# Patient Record
Sex: Female | Born: 1935 | Race: White | Hispanic: No | Smoking: Never smoker
Health system: Southern US, Community
[De-identification: ages and names within clinical notes are randomized; demographics above are authoritative.]

## PROBLEM LIST (undated history)

## (undated) DIAGNOSIS — I4892 Unspecified atrial flutter: Secondary | ICD-10-CM

## (undated) DIAGNOSIS — H9190 Unspecified hearing loss, unspecified ear: Secondary | ICD-10-CM

## (undated) DIAGNOSIS — J90 Pleural effusion, not elsewhere classified: Secondary | ICD-10-CM

## (undated) DIAGNOSIS — I509 Heart failure, unspecified: Secondary | ICD-10-CM

## (undated) DIAGNOSIS — I4821 Permanent atrial fibrillation: Secondary | ICD-10-CM

## (undated) HISTORY — DX: Pleural effusion, not elsewhere classified: J90

## (undated) HISTORY — DX: Heart failure, unspecified: I50.9

## (undated) HISTORY — PX: APPENDECTOMY: SHX54

## (undated) HISTORY — DX: Permanent atrial fibrillation: I48.21

## (undated) HISTORY — DX: Unspecified atrial flutter: I48.92

## (undated) HISTORY — PX: FIXATION KYPHOPLASTY: SHX860

## (undated) HISTORY — PX: CHOLECYSTECTOMY: SHX55

---

## 1998-01-26 ENCOUNTER — Observation Stay (HOSPITAL_COMMUNITY): Admission: RE | Admit: 1998-01-26 | Discharge: 1998-01-27 | Payer: Self-pay

## 1998-02-03 ENCOUNTER — Ambulatory Visit (HOSPITAL_COMMUNITY): Admission: RE | Admit: 1998-02-03 | Discharge: 1998-02-03 | Payer: Self-pay

## 1998-03-23 ENCOUNTER — Other Ambulatory Visit: Admission: RE | Admit: 1998-03-23 | Discharge: 1998-03-23 | Payer: Self-pay | Admitting: Cardiology

## 1999-11-02 ENCOUNTER — Encounter: Payer: Self-pay | Admitting: Cardiology

## 1999-11-02 ENCOUNTER — Encounter: Admission: RE | Admit: 1999-11-02 | Discharge: 1999-11-02 | Payer: Self-pay | Admitting: Cardiology

## 1999-11-14 ENCOUNTER — Encounter: Admission: RE | Admit: 1999-11-14 | Discharge: 1999-11-14 | Payer: Self-pay | Admitting: Gastroenterology

## 1999-11-14 ENCOUNTER — Encounter: Payer: Self-pay | Admitting: Gastroenterology

## 2000-11-16 ENCOUNTER — Encounter: Payer: Self-pay | Admitting: Internal Medicine

## 2000-11-16 ENCOUNTER — Encounter: Admission: RE | Admit: 2000-11-16 | Discharge: 2000-11-16 | Payer: Self-pay | Admitting: Internal Medicine

## 2001-09-04 ENCOUNTER — Encounter: Admission: RE | Admit: 2001-09-04 | Discharge: 2001-09-04 | Payer: Self-pay | Admitting: Internal Medicine

## 2001-09-04 ENCOUNTER — Encounter: Payer: Self-pay | Admitting: Internal Medicine

## 2001-11-18 ENCOUNTER — Encounter: Payer: Self-pay | Admitting: Internal Medicine

## 2001-11-18 ENCOUNTER — Encounter: Admission: RE | Admit: 2001-11-18 | Discharge: 2001-11-18 | Payer: Self-pay | Admitting: Internal Medicine

## 2002-01-15 ENCOUNTER — Emergency Department (HOSPITAL_COMMUNITY): Admission: EM | Admit: 2002-01-15 | Discharge: 2002-01-15 | Payer: Self-pay | Admitting: Emergency Medicine

## 2002-11-21 ENCOUNTER — Encounter: Admission: RE | Admit: 2002-11-21 | Discharge: 2002-11-21 | Payer: Self-pay | Admitting: Internal Medicine

## 2002-11-21 ENCOUNTER — Encounter: Payer: Self-pay | Admitting: Internal Medicine

## 2003-11-24 ENCOUNTER — Ambulatory Visit (HOSPITAL_COMMUNITY): Admission: RE | Admit: 2003-11-24 | Discharge: 2003-11-24 | Payer: Self-pay | Admitting: Internal Medicine

## 2004-10-15 ENCOUNTER — Emergency Department (HOSPITAL_COMMUNITY): Admission: EM | Admit: 2004-10-15 | Discharge: 2004-10-16 | Payer: Self-pay | Admitting: Emergency Medicine

## 2004-10-18 ENCOUNTER — Inpatient Hospital Stay (HOSPITAL_COMMUNITY): Admission: EM | Admit: 2004-10-18 | Discharge: 2004-10-23 | Payer: Self-pay | Admitting: Emergency Medicine

## 2004-10-19 ENCOUNTER — Encounter: Payer: Self-pay | Admitting: Cardiology

## 2005-06-26 ENCOUNTER — Ambulatory Visit (HOSPITAL_COMMUNITY): Admission: RE | Admit: 2005-06-26 | Discharge: 2005-06-26 | Payer: Self-pay | Admitting: Internal Medicine

## 2005-09-25 ENCOUNTER — Ambulatory Visit (HOSPITAL_COMMUNITY): Admission: RE | Admit: 2005-09-25 | Discharge: 2005-09-25 | Payer: Self-pay | Admitting: Internal Medicine

## 2006-07-05 ENCOUNTER — Ambulatory Visit (HOSPITAL_COMMUNITY): Admission: RE | Admit: 2006-07-05 | Discharge: 2006-07-05 | Payer: Self-pay | Admitting: Internal Medicine

## 2007-07-10 ENCOUNTER — Ambulatory Visit (HOSPITAL_COMMUNITY): Admission: RE | Admit: 2007-07-10 | Discharge: 2007-07-10 | Payer: Self-pay | Admitting: Internal Medicine

## 2008-08-06 ENCOUNTER — Ambulatory Visit (HOSPITAL_COMMUNITY): Admission: RE | Admit: 2008-08-06 | Discharge: 2008-08-06 | Payer: Self-pay | Admitting: Internal Medicine

## 2009-04-06 ENCOUNTER — Ambulatory Visit (HOSPITAL_COMMUNITY): Admission: RE | Admit: 2009-04-06 | Discharge: 2009-04-06 | Payer: Self-pay | Admitting: Internal Medicine

## 2009-08-23 ENCOUNTER — Ambulatory Visit (HOSPITAL_COMMUNITY): Admission: RE | Admit: 2009-08-23 | Discharge: 2009-08-23 | Payer: Self-pay | Admitting: Internal Medicine

## 2010-03-09 ENCOUNTER — Ambulatory Visit: Payer: Self-pay | Admitting: Cardiology

## 2010-04-06 ENCOUNTER — Ambulatory Visit: Payer: Self-pay | Admitting: *Deleted

## 2010-04-12 ENCOUNTER — Ambulatory Visit: Payer: Self-pay | Admitting: Cardiology

## 2010-05-02 ENCOUNTER — Encounter: Admission: RE | Admit: 2010-05-02 | Discharge: 2010-05-02 | Payer: Self-pay | Admitting: Internal Medicine

## 2010-05-10 ENCOUNTER — Ambulatory Visit: Payer: Self-pay | Admitting: Cardiology

## 2010-05-13 ENCOUNTER — Ambulatory Visit: Payer: Self-pay | Admitting: Cardiology

## 2010-05-18 ENCOUNTER — Ambulatory Visit: Payer: Self-pay | Admitting: Cardiovascular Disease

## 2010-05-25 ENCOUNTER — Ambulatory Visit (HOSPITAL_COMMUNITY)
Admission: RE | Admit: 2010-05-25 | Discharge: 2010-05-25 | Payer: Self-pay | Source: Home / Self Care | Admitting: Internal Medicine

## 2010-05-31 ENCOUNTER — Ambulatory Visit: Payer: Self-pay | Admitting: Cardiology

## 2010-06-10 ENCOUNTER — Ambulatory Visit: Payer: Self-pay | Admitting: Cardiology

## 2010-06-24 ENCOUNTER — Ambulatory Visit: Payer: Self-pay | Admitting: Cardiology

## 2010-07-01 ENCOUNTER — Ambulatory Visit: Payer: Self-pay | Admitting: Cardiology

## 2010-07-08 ENCOUNTER — Ambulatory Visit: Payer: Self-pay | Admitting: Cardiology

## 2010-07-22 ENCOUNTER — Ambulatory Visit: Payer: Self-pay | Admitting: Cardiology

## 2010-08-05 ENCOUNTER — Ambulatory Visit (HOSPITAL_COMMUNITY)
Admission: RE | Admit: 2010-08-05 | Discharge: 2010-08-05 | Payer: Self-pay | Source: Home / Self Care | Attending: Internal Medicine | Admitting: Internal Medicine

## 2010-08-17 ENCOUNTER — Ambulatory Visit (HOSPITAL_COMMUNITY)
Admission: RE | Admit: 2010-08-17 | Discharge: 2010-08-17 | Payer: Self-pay | Source: Home / Self Care | Attending: Interventional Radiology | Admitting: Interventional Radiology

## 2010-08-22 LAB — CBC
HCT: 41.2 % (ref 36.0–46.0)
Hemoglobin: 13.7 g/dL (ref 12.0–15.0)
MCH: 30.9 pg (ref 26.0–34.0)
MCHC: 33.3 g/dL (ref 30.0–36.0)
MCV: 93 fL (ref 78.0–100.0)
Platelets: 148 10*3/uL — ABNORMAL LOW (ref 150–400)
RBC: 4.43 MIL/uL (ref 3.87–5.11)
RDW: 13 % (ref 11.5–15.5)
WBC: 6.1 10*3/uL (ref 4.0–10.5)

## 2010-08-22 LAB — BASIC METABOLIC PANEL
BUN: 16 mg/dL (ref 6–23)
CO2: 29 mEq/L (ref 19–32)
Calcium: 9.5 mg/dL (ref 8.4–10.5)
Chloride: 99 mEq/L (ref 96–112)
Creatinine, Ser: 0.76 mg/dL (ref 0.4–1.2)
GFR calc Af Amer: >60 mL/min (ref >60)
GFR calc non Af Amer: >60 mL/min (ref >60)
Glucose, Bld: 95 mg/dL (ref 70–99)
Potassium: 3.8 mEq/L (ref 3.5–5.1)
Sodium: 138 mEq/L (ref 135–145)

## 2010-08-22 LAB — APTT: aPTT: 27 seconds (ref 24–37)

## 2010-08-22 LAB — PROTIME-INR
INR: 1.05 (ref 0.00–1.49)
Prothrombin Time: 13.9 seconds (ref 11.6–15.2)

## 2010-08-23 ENCOUNTER — Encounter (HOSPITAL_COMMUNITY)
Admission: RE | Admit: 2010-08-23 | Discharge: 2010-09-06 | Payer: Self-pay | Source: Home / Self Care | Attending: Internal Medicine | Admitting: Internal Medicine

## 2010-08-31 ENCOUNTER — Ambulatory Visit (HOSPITAL_COMMUNITY)
Admission: RE | Admit: 2010-08-31 | Discharge: 2010-08-31 | Payer: Self-pay | Source: Home / Self Care | Attending: Interventional Radiology | Admitting: Interventional Radiology

## 2010-09-06 ENCOUNTER — Ambulatory Visit: Payer: Self-pay | Admitting: Cardiology

## 2010-09-22 ENCOUNTER — Other Ambulatory Visit (INDEPENDENT_AMBULATORY_CARE_PROVIDER_SITE_OTHER): Payer: Medicare Other

## 2010-09-22 DIAGNOSIS — I4891 Unspecified atrial fibrillation: Secondary | ICD-10-CM

## 2010-09-22 DIAGNOSIS — Z7901 Long term (current) use of anticoagulants: Secondary | ICD-10-CM

## 2010-10-11 ENCOUNTER — Ambulatory Visit (INDEPENDENT_AMBULATORY_CARE_PROVIDER_SITE_OTHER): Payer: Medicare Other | Admitting: Cardiology

## 2010-10-11 DIAGNOSIS — Z792 Long term (current) use of antibiotics: Secondary | ICD-10-CM

## 2010-10-11 DIAGNOSIS — I4891 Unspecified atrial fibrillation: Secondary | ICD-10-CM

## 2010-11-08 ENCOUNTER — Ambulatory Visit (INDEPENDENT_AMBULATORY_CARE_PROVIDER_SITE_OTHER): Payer: Medicare Other | Admitting: *Deleted

## 2010-11-08 DIAGNOSIS — I4891 Unspecified atrial fibrillation: Secondary | ICD-10-CM

## 2010-11-08 DIAGNOSIS — Z7901 Long term (current) use of anticoagulants: Secondary | ICD-10-CM

## 2010-11-08 LAB — POCT INR: INR: 2.7

## 2010-11-23 ENCOUNTER — Ambulatory Visit (HOSPITAL_COMMUNITY): Payer: Medicare Other | Attending: Internal Medicine

## 2010-11-23 DIAGNOSIS — M810 Age-related osteoporosis without current pathological fracture: Secondary | ICD-10-CM | POA: Insufficient documentation

## 2010-12-09 ENCOUNTER — Ambulatory Visit (INDEPENDENT_AMBULATORY_CARE_PROVIDER_SITE_OTHER): Payer: Medicare Other | Admitting: *Deleted

## 2010-12-09 DIAGNOSIS — I4891 Unspecified atrial fibrillation: Secondary | ICD-10-CM

## 2010-12-09 LAB — POCT INR: INR: 2.8

## 2010-12-23 NOTE — Consult Note (Signed)
NAME:  [name], [name] NO.:  [id number]   MEDICAL RECORD NO.:  [id number]          PATIENT TYPE:  INP   LOCATION:  1830                         FACILITY:  [facility]   PHYSICIAN:  Colleen Can. Deborah Chalk, M.D.DATE OF BIRTH:  1920-05-14   DATE OF CONSULTATION:  10/18/2004  DATE OF DISCHARGE:                                   CONSULTATION   Thank you very much for asking [place] to see Ms. Limon.  She is a very pleasant  77 year old female admitted with pneumonia.  She was also noted to have  atrial fibrillation with a rapid ventricular response of new onset.  She  notes that it began approximately four days ago and she began to feel queasy  and felt tired, had a generalized aching over her chest.  She called her  primary care physician on Friday (three days ago) and was started on  antibiotics.  She had emergency room visit this past Sunday (one to two days  ago) and was noted to have atrial fibrillation.  Chest x-ray showed  pneumonia.  She had appointment today with Dr. Waynard Edwards and was admitted for  evaluation of her pneumonia as well as new onset atrial fibrillation and  flutter.   PAST MEDICAL HISTORY:  She had an exploratory laparotomy with pregnancy.  She had history of appendectomy, cholecystectomy in 1999, cataract surgery,  osteoporosis, osteoarthritis, hypertension, and glaucoma.   ALLERGIES:  SULFA, DOXYCYCLINE, CODEINE, DICLOFENATE, FOSAMAX, PREVACID.   CURRENT MEDICATIONS:  Calcium, Actinol and Xalatan eye drops.   FAMILY HISTORY:  Father died age 66 of prostate cancer, mother died age 84  of pneumonia and measles.   SOCIAL HISTORY:  Recently, she has been caring for her sister, rarely uses  alcohol and never smokes.  She is married and retired.   REVIEW OF SYSTEMS:  She has no fever that she notes but she has had chills.  She has had a slight cough today but none really before that.  She was not  aware of atrial fibrillation, but she does have chronic skips  that she  notes.  She did have probable hallucinations related to Avalox.   PHYSICAL EXAMINATION:  GENERAL:  She is pleasant.  She is in no acute  distress.  She is elderly.  VITAL SIGNS:  Blood pressure 137/82, heart rate is 103 and regular.  Temperature was 98.6.  SKIN:  Warm and dry.  Color is normal.  NECK:  Supple, no jugular venous distention.  LUNGS:  Congested in both bases, left somewhat greater than right.  HEART:  Fairly regular rhythm, approximately 85 to 90.  There is no  significant murmur.  ABDOMEN:  Soft, nontender, no organomegaly.  EXTREMITIES:  Without edema.   LABORATORY DATA:  Chest x-ray showed increasing airspace disease in the left  lung and also questionable infiltrate on the right base.  CBC shows white  count 15,000, hematocrit of 33.  Chemistries are basically normal, troponin  and CK are negative.  EKG shows atrial flutter with a controlled ventricular  response.   OVERALL IMPRESSION:  1.  Atrial fibrillation/flutter with controlled ventricular  response.  2.  Pneumonia.   PLAN:  Agree with treatment of pneumonia as well as rate control and  anticoagulation.  Will check a 2D echo as well as a TSH and thyroid studies.  We will try to start Coumadin at this point.  If she converts to a sinus  rhythm, we may continue the Coumadin for only a short period of time.  If  she does not convert spontaneously to normal sinus rhythm, we will consider  outpatient cardioversion in approximately three to four weeks.  2D echo is  pending.      SNT/MEDQ  D:  10/18/2004  T:  10/18/2004  Job:  [id number]   cc:   Loraine Leriche A. Waynard Edwards, M.D.  [address]  [place]  [place] [address]  Fax: [phone]

## 2010-12-23 NOTE — Discharge Summary (Signed)
NAME:  [name], [name]              ACCOUNT NO.:  [id number]   MEDICAL RECORD NO.:  [id number]          PATIENT TYPE:  INP   LOCATION:  3703                         FACILITY:  [facility]   PHYSICIAN:  Mark A. Perini, M.D.   DATE OF BIRTH:  03-17-1920   DATE OF ADMISSION:  10/18/2004  DATE OF DISCHARGE:  10/23/2004                                 DISCHARGE SUMMARY   DISCHARGE DIAGNOSES:  1.  Community-acquired pneumonia.  2.  New onset atrial fibrillation.  3.  Hypokalemia.  4.  Osteoporosis.  5.  Anemia, which will need outpatient followup.   PROCEDURES:  1.  Cardiology consultation.  2.  A 2D echocardiogram which showed an ejection fraction of 65%, mild      aortic regurgitation, mild tricuspid regurgitation, mildly increased      pulmonary artery systolic pressure, but otherwise normal.   DISCHARGE MEDICATIONS:  1.  Prilosec 20 mg daily with food.  2.  Mucinex over the counter 600 mg b.i.d. as needed for cough.  3.  Cardizem CD 240 mg one daily.  4.  Potassium chloride 20 mg daily.  5.  Coumadin 5 mg 1 tablet daily starting on Monday after discharge.  6.  Tylenol 650 mg q.6 h. as needed.  7.  Augmentin 875 mg b.i.d. with food for seven further days.   HISTORY OF PRESENT ILLNESS:  [name] is a pleasant 77 year old female  admitted with pneumonia.  She was also found to have new onset of atrial  fibrillation with rapid ventricular response.  Her syndrome started  approximately four to five days ago when she began to feel queasy and tired  and have some aching over her chest area.  She also had some cough and  chills.  She was started empirically on antibiotics, and then the following  day she presented to the emergency room.  At that time, she was noted to  have pneumonia and was continued on antibiotics.  She was also noted to have  atrial fibrillation.  She followed up in our office two days later and was  found to still be in atrial fibrillation and felt quite poorly and  therefore  was admitted for further care.   HOSPITAL COURSE:  [name] was admitted to a telemetry bed.  She remained  in atrial fibrillation during her entire stay.  Repeat chest x-ray confirmed  bilateral community-acquired pneumonia. She was treated with Rocephin and  azithromycin intravenously.  She did not have any hypoxia and remained  stable from a pulmonary standpoint.  She was treated with Atrovent  nebulizers and Mucinex as well as antibiotics.  Cardiology was consulted to  help manage her atrial fibrillation.  She was placed on a heparin drip and  was anticoagulated with Coumadin.  Her rate was controlled with oral  Cardizem therapy.  By October 23, 2004, she was deemed stable for discharge  home.   DISCHARGE PHYSICAL EXAMINATION:  VITAL SIGNS:  Stable.  BLOOD PRESSURE:  118/73.  TEMPERATURE:  She had no fevers.  HEART RATE:  She remained in atrial fibrillation.  LUNGS:  Clear bilaterally.  HEART:  Irregularly irregular.  EXTREMITIES:  There was no edema.  ABDOMEN:  Soft.  SATURATIONS:  100% on room air.   DISCHARGE LABORATORY DATA:  INR was moving out and was 1.4 at the time of  discharge.  Sodium 137, potassium 4.6, chloride 106, CO2 25, BUN 4,  creatinine 0.7, glucose 104.  White count 9, which was decreased compared to  admission, hemoglobin 11, platelet count 347,000.   FOLLOW-UP INSTRUCTIONS:  1.  [name] is to follow a low-salt diet.  2.  She is to follow up in Dr. Ronnald Nian office for management of her      Coumadin and possible later cardioversion.  3.  She will follow up in our office in two weeks to ensure that her      pneumonia is resolving.  4.  She is to call at any time if there are any problems or concerns.      MAP/MEDQ  D:  10/26/2004  T:  10/26/2004  Job:  [id number]   cc:   Colleen Can. Deborah Chalk, M.D.  Fax: [phone]

## 2010-12-23 NOTE — H&P (Signed)
NAME:  [name], [name]              ACCOUNT NO.:  [id number]   MEDICAL RECORD NO.:  [id number]          PATIENT TYPE:  INP   LOCATION:  1830                         FACILITY:  [facility]   PHYSICIAN:  Mark A. Perini, M.D.   DATE OF BIRTH:  [birth year]   DATE OF ADMISSION:  10/18/2004  DATE OF DISCHARGE:                                HISTORY & PHYSICAL   CHIEF COMPLAINT:  1.  Malaise.  2.  Fatigue.  3.  Productive cough.   HISTORY OF PRESENT ILLNESS:  [name] is a pleasant, 77 year old,  Caucasian female with past medical history significant for osteoporosis and  degenerative joint disease of the back.  She has actually been very healthy  and vigorous.  However, last week she developed some temperatures and chills  and some discomfort in her chest with deep breathing.  She called on October 14, 2004, with these symptoms and was placed on Avelox 400 mg daily, as well  as Mucinex and Tylenol and was told to increase her fluids.  However, by  Saturday, October 15, 2004, she was feeling better and presented to the [facility] Emergency Room.  There she was diagnosed with a possible left hilar  pneumonia.  She was continued on Avelox.  She was also noted to have a low  potassium level of 3.1 and was given a prescription for potassium, which she  has not filled.  Furthermore, she was found to be in atrial fibrillation.  There was a misunderstanding in that it was felt that she had this  chronically, although this is actually a new condition.  She presented to  our office today, not feeling any better than before.  She continued to have  some productive cough.  She has some dry mouth.  She has significant  malaise.  She does have some discomfort when she coughs or takes deep  breaths in her chest area.  She has had very poor appetite.  She has been  quite fatigued.  She feels the Avelox makes her feel quite queasy and sick  on her stomach.  She was inadvertently given two Avelox tablets in 24  tablets on October 15, 2004, which caused some brief hallucinations.  In the  office, she was found to be in atrial fibrillation with a ventricular rate  of approximately 100 beats per minute.  Given her fatigue and the onset of  new atrial fibrillation, which has persisted for at least three to four  days, it was decided to electively admit her to the hospital for further  management of her pneumonia and new atrial fibrillation.   PAST MEDICAL HISTORY:  1.  Status post exploratory laparotomy after her pregnancy in 1960, but no      pathology was found.  2.  History of appendectomy at that time.  3.  History of cholecystectomy in 1999.  4.  History of two cataract surgeries.  5.  G3, P2, SAB 1 parity status.  6.  Postmenopausal since her late 30s.  7.  Osteoporosis that is severe, originally diagnosed in 93.  She has  compression fractures at L1, L2, T7 and T8.  8.  Colon polyps.  9.  Diverticulosis.  10. Large duodenal diverticulum.  11. Degenerative joint disease of the back.  12. Cervical polyp noted in 2003.  13. First degree atrioventricular block noted by EKG in January of 2004.   ALLERGIES:  1.  SULFA.  2.  DOXYCYCLINE.  3.  CODEINE.  4.  DICLOFENAC.  5.  FOSAMAX.  6.  PREVACID.   CURRENT MEDICATIONS:  1.  Calcium 1500 mg daily.  2.  Actonel 35 mg each week.  3.  Xalatan eyedrops daily.   SOCIAL HISTORY:  She is married.  Her husband, Jake Shark, is with her.  She has  been married since 41.  She has one daughter and two sons.  She had one  son die of a tumor in his brainstem.  She has two grandsons.  She has  education extending through some business school.  She is retired and does  some Agricultural consultant work currently.  She has never smoked tobacco.  She has a  rare glass of wine.  No drug use history.   FAMILY HISTORY:  Father died at age 70 of probable prostate cancer.  Mother  died at age 68 of pneumonia and measles.  She has multiple siblings.  One  brother died  of drowning.  Two brothers are living.  One brother died and  had depression problems.   REVIEW OF SYSTEMS:  As per the history of present illness.  She has been  some short of breath and has had some trouble finishing sentences.  There  has been no blood from above or below.  She has been moving her bowels  fairly normally.  No significant edema noted.  Otherwise as per the history  of present illness.   PHYSICAL EXAMINATION:  WEIGHT:  104 pound, which is down 1 pound from her  last visit.  VITAL SIGNS:  Blood pressure 138/72, temperature 98.9 degrees, pulse 100,  97% saturation on room air.  GENERAL APPEARANCE:  She does appear somewhat ill and weak, however,  respirations are even and nonlabored.  HEENT:  Tympanic membranes are normal. The mucosa is normal.  NECK:  There is no JVD.  RESPIRATORY:  Decreased breath sounds bilaterally with a few rales in the  left base.  HEART:  Irregularly with a 1-2/6 murmur at the left sternal border in  systole.  There is no significant edema.  ABDOMEN:  Soft and nontender with normoactive bowel sounds.  NEUROLOGIC:  She is neurologically grossly intact.   ASSESSMENT AND PLAN:  1.  Pneumonia.  The patient is still struggling to recover from this and she      is not tolerating the Avelox very well.  Therefore, we will switch her      to Rocephin and azithromycin.  We will check a new chest x-ray as well.  2.  New atrial fibrillation with some elevated heart rate response.  We will      admit and place on a telemetry bed.  We will start a heparin drip.  We      will rate control with oral Cardizem.  We will obtain a cardiology      consult as      well.  3.  Hypokalemia.  Check potassium levels and replace as needed.   ETHICS:  This patient is a full code status.      MAP/MEDQ  D:  10/18/2004  T:  10/18/2004  Job:  [id number]   cc:   [facility]

## 2011-01-09 ENCOUNTER — Ambulatory Visit: Payer: Self-pay | Admitting: *Deleted

## 2011-01-09 NOTE — Progress Notes (Signed)
INR scheduled date is discontinued.  Pt will have her INR drawn at her PCP.

## 2011-02-15 ENCOUNTER — Encounter (HOSPITAL_COMMUNITY): Payer: Medicare Other | Attending: Internal Medicine

## 2011-02-15 DIAGNOSIS — M810 Age-related osteoporosis without current pathological fracture: Secondary | ICD-10-CM | POA: Insufficient documentation

## 2011-05-18 ENCOUNTER — Encounter (HOSPITAL_COMMUNITY): Payer: Medicare Other

## 2011-10-19 ENCOUNTER — Encounter (HOSPITAL_COMMUNITY): Payer: Self-pay

## 2011-10-23 ENCOUNTER — Ambulatory Visit (HOSPITAL_COMMUNITY)
Admission: RE | Admit: 2011-10-23 | Discharge: 2011-10-23 | Disposition: A | Discharge status: Home / Self Care | Payer: Medicare Other | Source: Ambulatory Visit | Attending: Internal Medicine | Admitting: Internal Medicine

## 2011-10-23 ENCOUNTER — Encounter (HOSPITAL_COMMUNITY): Payer: Self-pay

## 2011-10-23 DIAGNOSIS — M810 Age-related osteoporosis without current pathological fracture: Secondary | ICD-10-CM | POA: Insufficient documentation

## 2011-10-23 MED ORDER — SODIUM CHLORIDE 0.9 % IV SOLN
INTRAVENOUS | Status: DC
  Administered 2011-10-23: 11:00:00 via INTRAVENOUS

## 2011-10-23 MED ORDER — ZOLEDRONIC ACID 5 MG/100ML IV SOLN
5.0000 mg | Freq: Once | INTRAVENOUS | Status: AC
  Administered 2011-10-23: 5 mg via INTRAVENOUS
  Filled 2011-10-23: qty 100

## 2011-10-23 NOTE — Discharge Instructions (Signed)
Zoledronic Acid injection (Paget's Disease, Osteoporosis) What is this medicine? ZOLEDRONIC ACID (ZOE le dron ik AS id) lowers the amount of calcium loss from bone. It is used to treat Paget's disease and osteoporosis in women. This medicine may be used for other purposes; ask your health care provider or pharmacist if you have questions. What should I tell my health care provider before I take this medicine? They need to know if you have any of these conditions: -aspirin-sensitive asthma -dental disease -kidney disease -low levels of calcium in the blood -past surgery on the parathyroid gland or intestines -an unusual or allergic reaction to zoledronic acid, other medicines, foods, dyes, or preservatives -pregnant or trying to get pregnant -breast-feeding How should I use this medicine? This medicine is for infusion into a vein. It is given by a health care professional in a hospital or clinic setting. Talk to your pediatrician regarding the use of this medicine in children. This medicine is not approved for use in children. Overdosage: If you think you have taken too much of this medicine contact a poison control center or emergency room at once. NOTE: This medicine is only for you. Do not share this medicine with others. What if I miss a dose? It is important not to miss your dose. Call your doctor or health care professional if you are unable to keep an appointment. What may interact with this medicine? -certain antibiotics given by injection -NSAIDs, medicines for pain and inflammation, like ibuprofen or naproxen -some diuretics like bumetanide, furosemide -teriparatide This list may not describe all possible interactions. Give your health care provider a list of all the medicines, herbs, non-prescription drugs, or dietary supplements you use. Also tell them if you smoke, drink alcohol, or use illegal drugs. Some items may interact with your medicine. What should I watch for while  using this medicine? Visit your doctor or health care professional for regular checkups. It may be some time before you see the benefit from this medicine. Do not stop taking your medicine unless your doctor tells you to. Your doctor may order blood tests or other tests to see how you are doing. Women should inform their doctor if they wish to become pregnant or think they might be pregnant. There is a potential for serious side effects to an unborn child. Talk to your health care professional or pharmacist for more information. You should make sure that you get enough calcium and vitamin D while you are taking this medicine. Discuss the foods you eat and the vitamins you take with your health care professional. Some people who take this medicine have severe bone, joint, and/or muscle pain. This medicine may also increase your risk for a broken thigh bone. Tell your doctor right away if you have pain in your upper leg or groin. Tell your doctor if you have any pain that does not go away or that gets worse. What side effects may I notice from receiving this medicine? Side effects that you should report to your doctor or health care professional as soon as possible: -allergic reactions like skin rash, itching or hives, swelling of the face, lips, or tongue -breathing problems -changes in vision -feeling faint or lightheaded, falls -jaw burning, cramping, or pain -muscle cramps, stiffness, or weakness -trouble passing urine or change in the amount of urine Side effects that usually do not require medical attention (report to your doctor or health care professional if they continue or are bothersome): -bone, joint, or muscle pain -fever -  irritation at site where injected -loss of appetite -nausea, vomiting -stomach upset -tired This list may not describe all possible side effects. Call your doctor for medical advice about side effects. You may report side effects to FDA at 1-800-FDA-1088. Where  should I keep my medicine? This drug is given in a hospital or clinic and will not be stored at home. NOTE: This sheet is a summary. It may not cover all possible information. If you have questions about this medicine, talk to your doctor, pharmacist, or health care provider.  2012, [employer]. (01/20/2011 9:08:15 AM) 

## 2012-06-25 ENCOUNTER — Encounter: Payer: Self-pay | Admitting: Cardiology

## 2012-09-17 HISTORY — PX: GLAUCOMA SURGERY: SHX656

## 2012-09-30 ENCOUNTER — Encounter: Payer: Self-pay | Admitting: Cardiology

## 2012-10-30 ENCOUNTER — Other Ambulatory Visit (HOSPITAL_COMMUNITY): Payer: Self-pay | Admitting: Internal Medicine

## 2012-11-01 ENCOUNTER — Ambulatory Visit (HOSPITAL_COMMUNITY): Payer: Medicare Other

## 2012-11-04 ENCOUNTER — Ambulatory Visit (HOSPITAL_COMMUNITY)
Admission: RE | Admit: 2012-11-04 | Discharge: 2012-11-04 | Disposition: A | Discharge status: Home / Self Care | Payer: Medicare Other | Source: Ambulatory Visit | Attending: Internal Medicine | Admitting: Internal Medicine

## 2012-11-04 ENCOUNTER — Encounter (HOSPITAL_COMMUNITY): Payer: Self-pay

## 2012-11-04 DIAGNOSIS — M810 Age-related osteoporosis without current pathological fracture: Secondary | ICD-10-CM | POA: Insufficient documentation

## 2012-11-04 MED ORDER — SODIUM CHLORIDE 0.9 % IV SOLN
INTRAVENOUS | Status: AC
  Administered 2012-11-04: 13:00:00 via INTRAVENOUS

## 2012-11-04 MED ORDER — ZOLEDRONIC ACID 5 MG/100ML IV SOLN
5.0000 mg | Freq: Once | INTRAVENOUS | Status: AC
  Administered 2012-11-04: 5 mg via INTRAVENOUS
  Filled 2012-11-04: qty 100

## 2013-05-12 ENCOUNTER — Ambulatory Visit (INDEPENDENT_AMBULATORY_CARE_PROVIDER_SITE_OTHER): Payer: Medicare Other | Admitting: Pharmacist

## 2013-05-12 DIAGNOSIS — I4891 Unspecified atrial fibrillation: Secondary | ICD-10-CM

## 2013-05-12 LAB — POCT INR: INR: 2.4

## 2013-06-19 ENCOUNTER — Ambulatory Visit (INDEPENDENT_AMBULATORY_CARE_PROVIDER_SITE_OTHER): Payer: Medicare Other | Admitting: Pharmacist

## 2013-06-19 DIAGNOSIS — I4891 Unspecified atrial fibrillation: Secondary | ICD-10-CM

## 2013-06-19 LAB — POCT INR: INR: 2.5

## 2013-07-09 ENCOUNTER — Encounter: Payer: Self-pay | Admitting: Interventional Cardiology

## 2013-07-09 ENCOUNTER — Ambulatory Visit (INDEPENDENT_AMBULATORY_CARE_PROVIDER_SITE_OTHER): Payer: Medicare Other | Admitting: Interventional Cardiology

## 2013-07-09 VITALS — BP 130/84 | HR 50 | Ht 59.0 in | Wt 93.0 lb

## 2013-07-09 DIAGNOSIS — I4891 Unspecified atrial fibrillation: Secondary | ICD-10-CM

## 2013-07-09 DIAGNOSIS — Z7901 Long term (current) use of anticoagulants: Secondary | ICD-10-CM

## 2013-07-09 DIAGNOSIS — R4181 Age-related cognitive decline: Secondary | ICD-10-CM

## 2013-07-09 DIAGNOSIS — R54 Age-related physical debility: Secondary | ICD-10-CM

## 2013-07-09 NOTE — Patient Instructions (Signed)
Your physician recommends that you continue on your current medications as directed. Please refer to the Current Medication list given to you today.  Your physician wants you to follow-up in: 1 year. You will receive a reminder letter in the mail two months in advance. If you don't receive a letter, please call our office to schedule the follow-up appointment.  

## 2013-07-09 NOTE — Progress Notes (Signed)
Patient ID: [name], female   DOB: 16-Mar-1920, 79 y.o.   MRN: [id number] Past Medical History  Paroxysmal atrial fibrillation   Osteoporosis with T12 compression fracture      [address] [place], [place]  [address] Phone: [phone] Fax:  [phone]  Date:  07/09/2013   ID:  [name], DOB 10/30/1919, MRN [id number]  PCP:  Ezequiel Kayser, MD   ASSESSMENT:  1. Atrial fibrillation with controlled rate 2. Chronic anticoagulation therapy without bleeding complications  PLAN:  1. Clinical followup in one year 2. Call if balance problems, falls, leading, or syncope.   SUBJECTIVE: [name] is a 79 y.o. female who is doing well. She's weathering the storm after her husband's death. She denies chest pain, dyspnea, syncope, and palpitations. No bleeding complications.   Wt Readings from Last 3 Encounters:  07/09/13 93 lb (42.185 kg)  11/04/12 85 lb (38.556 kg)     Past Medical History  Diagnosis Date  . Osteoporosis   . Atrial fib/flutter, transient   . Glaucoma   . Osteoporosis     Current Outpatient Prescriptions  Medication Sig Dispense Refill  . calcium-vitamin D (OSCAL WITH D) 500-200 MG-UNIT per tablet Take 1 tablet by mouth daily.      . dorzolamide-timolol (COSOPT) 22.3-6.8 MG/ML ophthalmic solution 1 drop 2 (two) times daily.       . fluticasone (FLONASE) 50 MCG/ACT nasal spray Place 2 sprays into the nose as needed for rhinitis.      . Vitamin D, Ergocalciferol, (DRISDOL) 50000 UNITS CAPS Take 50,000 Units by mouth every 7 (seven) days.      Marland Kitchen warfarin (COUMADIN) 2.5 MG tablet Take 2.5 mg by mouth daily.      . zoledronic acid (RECLAST) 5 MG/100ML SOLN Inject 5 mg into the vein once.       No current facility-administered medications for this visit.    Allergies:    Allergies  Allergen Reactions  . Sulfa Antibiotics Anaphylaxis  . Risedronate Sodium     headaches    Social History:  The patient  reports that she  has never smoked. She does not have any smokeless tobacco history on file. She reports that she drinks alcohol.   ROS:  Please see the history of present illness.   Denies transient neurological complaints   All other systems reviewed and negative.   OBJECTIVE: VS:  BP 130/84  Pulse 50  Ht 4\' 11"  (1.499 m)  Wt 93 lb (42.185 kg)  BMI 18.77 kg/m2 Well nourished, well developed, in no acute distress, elderly and frail HEENT: normal Neck: JVD flat. Carotid bruit absent  Cardiac:  normal S1, S2; RRR; no murmur Lungs:  clear to auscultation bilaterally, no wheezing, rhonchi or rales Abd: soft, nontender, no hepatomegaly Ext: Edema absent. Pulses 2+ and symmetric Skin: warm and dry Neuro:  CNs 2-12 intact, no focal abnormalities noted  EKG:  Atrial flutter with   with flow rate of 240 beats per minute and variable ventricular response averaging 50 beats per minute    Signed, Darci Needle III, MD 07/09/2013 11:37 AM

## 2013-07-22 ENCOUNTER — Ambulatory Visit (INDEPENDENT_AMBULATORY_CARE_PROVIDER_SITE_OTHER): Payer: Medicare Other | Admitting: Pharmacist

## 2013-07-22 ENCOUNTER — Telehealth: Payer: Self-pay | Admitting: Cardiology

## 2013-07-22 DIAGNOSIS — I4891 Unspecified atrial fibrillation: Secondary | ICD-10-CM

## 2013-07-22 LAB — POCT INR: INR: 2.2

## 2013-07-22 NOTE — Telephone Encounter (Signed)
This encounter was opened as error

## 2013-09-02 ENCOUNTER — Ambulatory Visit (INDEPENDENT_AMBULATORY_CARE_PROVIDER_SITE_OTHER): Payer: Medicare Other | Admitting: Pharmacist

## 2013-09-02 DIAGNOSIS — Z5181 Encounter for therapeutic drug level monitoring: Secondary | ICD-10-CM

## 2013-09-02 DIAGNOSIS — I4891 Unspecified atrial fibrillation: Secondary | ICD-10-CM

## 2013-09-02 LAB — POCT INR: INR: 2.9

## 2013-10-14 ENCOUNTER — Ambulatory Visit (INDEPENDENT_AMBULATORY_CARE_PROVIDER_SITE_OTHER): Payer: Medicare Other | Admitting: Pharmacist

## 2013-10-14 DIAGNOSIS — I4891 Unspecified atrial fibrillation: Secondary | ICD-10-CM

## 2013-10-14 DIAGNOSIS — Z5181 Encounter for therapeutic drug level monitoring: Secondary | ICD-10-CM

## 2013-10-14 LAB — POCT INR: INR: 2.9

## 2013-10-14 MED ORDER — WARFARIN SODIUM 5 MG PO TABS: ORAL_TABLET | ORAL | Status: DC

## 2013-11-24 ENCOUNTER — Other Ambulatory Visit (HOSPITAL_COMMUNITY): Payer: Self-pay | Admitting: Internal Medicine

## 2013-11-25 ENCOUNTER — Encounter (HOSPITAL_COMMUNITY): Payer: Self-pay

## 2013-11-25 ENCOUNTER — Ambulatory Visit (HOSPITAL_COMMUNITY)
Admission: RE | Admit: 2013-11-25 | Discharge: 2013-11-25 | Disposition: A | Discharge status: Home / Self Care | Payer: Medicare Other | Source: Ambulatory Visit | Attending: Internal Medicine | Admitting: Internal Medicine

## 2013-11-25 ENCOUNTER — Ambulatory Visit (INDEPENDENT_AMBULATORY_CARE_PROVIDER_SITE_OTHER): Payer: Medicare Other | Admitting: *Deleted

## 2013-11-25 DIAGNOSIS — I4891 Unspecified atrial fibrillation: Secondary | ICD-10-CM

## 2013-11-25 DIAGNOSIS — Z5181 Encounter for therapeutic drug level monitoring: Secondary | ICD-10-CM

## 2013-11-25 DIAGNOSIS — M810 Age-related osteoporosis without current pathological fracture: Secondary | ICD-10-CM | POA: Insufficient documentation

## 2013-11-25 HISTORY — DX: Unspecified hearing loss, unspecified ear: H91.90

## 2013-11-25 LAB — POCT INR: INR: 2.5

## 2013-11-25 MED ORDER — ZOLEDRONIC ACID 5 MG/100ML IV SOLN
5.0000 mg | Freq: Once | INTRAVENOUS | Status: AC
  Administered 2013-11-25: 5 mg via INTRAVENOUS
  Filled 2013-11-25: qty 100

## 2013-11-25 MED ORDER — SODIUM CHLORIDE 0.9 % IV SOLN
250.0000 mL | Freq: Once | INTRAVENOUS | Status: AC
  Administered 2013-11-25: 250 mL via INTRAVENOUS

## 2013-11-25 NOTE — Discharge Instructions (Signed)
RECLAST °Zoledronic Acid injection (Paget's Disease, Osteoporosis) °What is this medicine? °ZOLEDRONIC ACID (ZOE le dron ik AS id) lowers the amount of calcium loss from bone. It is used to treat Paget's disease and osteoporosis in women. °This medicine may be used for other purposes; ask your health care provider or pharmacist if you have questions. °COMMON BRAND NAME(S): Reclast, Zometa °What should I tell my health care provider before I take this medicine? °They need to know if you have any of these conditions: °-aspirin-sensitive asthma °-cancer, especially if you are receiving medicines used to treat cancer °-dental disease or wear dentures °-infection °-kidney disease °-low levels of calcium in the blood °-past surgery on the parathyroid gland or intestines °-receiving corticosteroids like dexamethasone or prednisone °-an unusual or allergic reaction to zoledronic acid, other medicines, foods, dyes, or preservatives °-pregnant or trying to get pregnant °-breast-feeding °How should I use this medicine? °This medicine is for infusion into a vein. It is given by a health care professional in a hospital or clinic setting. °Talk to your pediatrician regarding the use of this medicine in children. This medicine is not approved for use in children. °Overdosage: If you think you have taken too much of this medicine contact a poison control center or emergency room at once. °NOTE: This medicine is only for you. Do not share this medicine with others. °What if I miss a dose? °It is important not to miss your dose. Call your doctor or health care professional if you are unable to keep an appointment. °What may interact with this medicine? °-certain antibiotics given by injection °-NSAIDs, medicines for pain and inflammation, like ibuprofen or naproxen °-some diuretics like bumetanide, furosemide °-teriparatide °This list may not describe all possible interactions. Give your health care provider a list of all the  medicines, herbs, non-prescription drugs, or dietary supplements you use. Also tell them if you smoke, drink alcohol, or use illegal drugs. Some items may interact with your medicine. °What should I watch for while using this medicine? °Visit your doctor or health care professional for regular checkups. It may be some time before you see the benefit from this medicine. Do not stop taking your medicine unless your doctor tells you to. Your doctor may order blood tests or other tests to see how you are doing. °Women should inform their doctor if they wish to become pregnant or think they might be pregnant. There is a potential for serious side effects to an unborn child. Talk to your health care professional or pharmacist for more information. °You should make sure that you get enough calcium and vitamin D while you are taking this medicine. Discuss the foods you eat and the vitamins you take with your health care professional. °Some people who take this medicine have severe bone, joint, and/or muscle pain. This medicine may also increase your risk for jaw problems or a broken thigh bone. Tell your doctor right away if you have severe pain in your jaw, bones, joints, or muscles. Tell your doctor if you have any pain that does not go away or that gets worse. °Tell your dentist and dental surgeon that you are taking this medicine. You should not have major dental surgery while on this medicine. See your dentist to have a dental exam and fix any dental problems before starting this medicine. Take good care of your teeth while on this medicine. Make sure you see your dentist for regular follow-up appointments. °What side effects may I notice from receiving this   medicine? °Side effects that you should report to your doctor or health care professional as soon as possible: °-allergic reactions like skin rash, itching or hives, swelling of the face, lips, or tongue °-anxiety, confusion, or depression °-breathing  problems °-changes in vision °-eye pain °-feeling faint or lightheaded, falls °-jaw pain, especially after dental work °-mouth sores °-muscle cramps, stiffness, or weakness °-trouble passing urine or change in the amount of urine °Side effects that usually do not require medical attention (report to your doctor or health care professional if they continue or are bothersome): °-bone, joint, or muscle pain °-constipation °-diarrhea °-fever °-hair loss °-irritation at site where injected °-loss of appetite °-nausea, vomiting °-stomach upset °-trouble sleeping °-trouble swallowing °-weak or tired °This list may not describe all possible side effects. Call your doctor for medical advice about side effects. You may report side effects to FDA at 1-800-FDA-1088. °Where should I keep my medicine? °This drug is given in a hospital or clinic and will not be stored at home. °NOTE: This sheet is a summary. It may not cover all possible information. If you have questions about this medicine, talk to your doctor, pharmacist, or health care provider. °© 2014, [employer]. (2013-01-06 10:03:48) °Osteoporosis °Throughout your life, your body breaks down old bone and replaces it with new bone. As you get older, your body does not replace bone as quickly as it breaks it down. By the age of 30 years, most people begin to gradually lose bone because of the imbalance between bone loss and replacement. Some people lose more bone than others. Bone loss beyond a specified normal degree is considered osteoporosis.  °Osteoporosis affects the strength and durability of your bones. The inside of the ends of your bones and your flat bones, like the bones of your pelvis, look like honeycomb, filled with tiny open spaces. As bone loss occurs, your bones become less dense. This means that the open spaces inside your bones become bigger and the walls between these spaces become thinner. This makes your bones weaker. Bones of a person with  osteoporosis can become so weak that they can break (fracture) during minor accidents, such as a simple fall. °CAUSES  °The following factors have been associated with the development of osteoporosis: °· Smoking. °· Drinking more than 2 alcoholic drinks several days per week. °· Long-term use of certain medicines: °· Corticosteroids. °· Chemotherapy medicines. °· Thyroid medicines. °· Antiepileptic medicines. °· Gonadal hormone suppression medicine. °· Immunosuppression medicine. °· Being underweight. °· Lack of physical activity. °· Lack of exposure to the sun. This can lead to vitamin D deficiency. °· Certain medical conditions: °· Certain inflammatory bowel diseases, such as Crohn disease and ulcerative colitis. °· Diabetes. °· Hyperthyroidism. °· Hyperparathyroidism. °RISK FACTORS °Anyone can develop osteoporosis. However, the following factors can increase your risk of developing osteoporosis: °· Gender Women are at higher risk than men. °· Age Being older than 50 years increases your risk. °· Ethnicity White and Asian people have an increased risk. °· Weight Being extremely underweight can increase your risk of osteoporosis. °· Family history of osteoporosis Having a family member who has developed osteoporosis can increase your risk. °SYMPTOMS  °Usually, people with osteoporosis have no symptoms.  °DIAGNOSIS  °Signs during a physical exam that may prompt your caregiver to suspect osteoporosis include: °· Decreased height. This is usually caused by the compression of the bones that form your spine (vertebrae) because they have weakened and become fractured. °· A curving or rounding of   the upper back (kyphosis). °To confirm signs of osteoporosis, your caregiver may request a procedure that uses 2 low-dose X-ray beams with different levels of energy to measure your bone mineral density (dual-energy X-ray absorptiometry [DXA]). Also, your caregiver may check your level of vitamin D. °TREATMENT  °The goal of  osteoporosis treatment is to strengthen bones in order to decrease the risk of bone fractures. There are different types of medicines available to help achieve this goal. Some of these medicines work by slowing the processes of bone loss. Some medicines work by increasing bone density. Treatment also involves making sure that your levels of calcium and vitamin D are adequate. °PREVENTION  °There are things you can do to help prevent osteoporosis. Adequate intake of calcium and vitamin D can help you achieve optimal bone mineral density. Regular exercise can also help, especially resistance and weight-bearing activities. If you smoke, quitting smoking is an important part of osteoporosis prevention. °MAKE SURE YOU: °· Understand these instructions. °· Will watch your condition. °· Will get help right away if you are not doing well or get worse. °FOR MORE INFORMATION °www.osteo.org and www.nof.org °Document Released: 05/03/2005 Document Revised: 11/18/2012 Document Reviewed: 07/08/2011 °ExitCare® Patient Information ©2014 [employer]. ° ° °

## 2013-11-26 ENCOUNTER — Encounter (HOSPITAL_COMMUNITY): Payer: Medicare Other

## 2014-01-06 ENCOUNTER — Ambulatory Visit (INDEPENDENT_AMBULATORY_CARE_PROVIDER_SITE_OTHER): Payer: Medicare Other

## 2014-01-06 DIAGNOSIS — Z5181 Encounter for therapeutic drug level monitoring: Secondary | ICD-10-CM

## 2014-01-06 DIAGNOSIS — I4891 Unspecified atrial fibrillation: Secondary | ICD-10-CM

## 2014-01-06 LAB — POCT INR: INR: 3

## 2014-02-17 ENCOUNTER — Ambulatory Visit (INDEPENDENT_AMBULATORY_CARE_PROVIDER_SITE_OTHER): Payer: Medicare Other

## 2014-02-17 DIAGNOSIS — I4891 Unspecified atrial fibrillation: Secondary | ICD-10-CM

## 2014-02-17 DIAGNOSIS — Z5181 Encounter for therapeutic drug level monitoring: Secondary | ICD-10-CM

## 2014-02-17 LAB — POCT INR: INR: 2.1

## 2014-03-07 ENCOUNTER — Other Ambulatory Visit: Payer: Self-pay | Admitting: Interventional Cardiology

## 2014-03-30 ENCOUNTER — Ambulatory Visit (INDEPENDENT_AMBULATORY_CARE_PROVIDER_SITE_OTHER): Payer: Medicare Other | Admitting: Pharmacist

## 2014-03-30 DIAGNOSIS — Z5181 Encounter for therapeutic drug level monitoring: Secondary | ICD-10-CM

## 2014-03-30 DIAGNOSIS — I4891 Unspecified atrial fibrillation: Secondary | ICD-10-CM

## 2014-03-30 LAB — POCT INR: INR: 2

## 2014-05-11 ENCOUNTER — Ambulatory Visit (INDEPENDENT_AMBULATORY_CARE_PROVIDER_SITE_OTHER): Payer: Medicare Other | Admitting: *Deleted

## 2014-05-11 DIAGNOSIS — Z5181 Encounter for therapeutic drug level monitoring: Secondary | ICD-10-CM

## 2014-05-11 DIAGNOSIS — I4891 Unspecified atrial fibrillation: Secondary | ICD-10-CM

## 2014-05-11 LAB — POCT INR: INR: 1.9

## 2014-05-19 ENCOUNTER — Ambulatory Visit (INDEPENDENT_AMBULATORY_CARE_PROVIDER_SITE_OTHER): Payer: Medicare Other | Admitting: *Deleted

## 2014-05-19 DIAGNOSIS — I4891 Unspecified atrial fibrillation: Secondary | ICD-10-CM

## 2014-05-19 DIAGNOSIS — Z5181 Encounter for therapeutic drug level monitoring: Secondary | ICD-10-CM

## 2014-05-19 LAB — POCT INR: INR: 3.3

## 2014-05-20 ENCOUNTER — Other Ambulatory Visit: Payer: Self-pay | Admitting: Dermatology

## 2014-06-01 ENCOUNTER — Ambulatory Visit (INDEPENDENT_AMBULATORY_CARE_PROVIDER_SITE_OTHER): Payer: Medicare Other | Admitting: *Deleted

## 2014-06-01 DIAGNOSIS — Z5181 Encounter for therapeutic drug level monitoring: Secondary | ICD-10-CM

## 2014-06-01 DIAGNOSIS — I4891 Unspecified atrial fibrillation: Secondary | ICD-10-CM

## 2014-06-01 LAB — POCT INR: INR: 2.4

## 2014-06-02 ENCOUNTER — Ambulatory Visit (INDEPENDENT_AMBULATORY_CARE_PROVIDER_SITE_OTHER): Payer: Medicare Other | Admitting: Nurse Practitioner

## 2014-06-02 ENCOUNTER — Encounter: Payer: Self-pay | Admitting: Nurse Practitioner

## 2014-06-02 ENCOUNTER — Telehealth: Payer: Self-pay | Admitting: Interventional Cardiology

## 2014-06-02 VITALS — BP 140/78 | HR 64 | Ht 60.0 in | Wt 93.4 lb

## 2014-06-02 DIAGNOSIS — I4821 Permanent atrial fibrillation: Secondary | ICD-10-CM

## 2014-06-02 DIAGNOSIS — J948 Other specified pleural conditions: Secondary | ICD-10-CM

## 2014-06-02 DIAGNOSIS — J90 Pleural effusion, not elsewhere classified: Secondary | ICD-10-CM | POA: Insufficient documentation

## 2014-06-02 DIAGNOSIS — I4892 Unspecified atrial flutter: Secondary | ICD-10-CM | POA: Insufficient documentation

## 2014-06-02 DIAGNOSIS — I482 Chronic atrial fibrillation: Secondary | ICD-10-CM

## 2014-06-02 LAB — ALBUMIN: Albumin: 3.2 g/dL — ABNORMAL LOW (ref 3.5–5.2)

## 2014-06-02 LAB — BASIC METABOLIC PANEL
BUN: 18 mg/dL (ref 6–23)
CALCIUM: 9.2 mg/dL (ref 8.4–10.5)
CHLORIDE: 102 meq/L (ref 96–112)
CO2: 30 meq/L (ref 19–32)
CREATININE: 0.7 mg/dL (ref 0.4–1.2)
GFR: 84.52 mL/min (ref >60.00)
GLUCOSE: 89 mg/dL (ref 70–99)
Potassium: 4.1 mEq/L (ref 3.5–5.1)
Sodium: 137 mEq/L (ref 135–145)

## 2014-06-02 MED ORDER — FUROSEMIDE 20 MG PO TABS: 20.0000 mg | ORAL_TABLET | Freq: Every day | ORAL | Status: DC

## 2014-06-02 NOTE — Progress Notes (Signed)
Patient Name: [name] Date of Encounter: 06/02/2014  Primary Care Provider:  Ezequiel KayserPERINI,MARK A, MD Primary Cardiologist:  Mendel RyderH. Smith, MD   Patient Profile  80 y/o female with a h/o persistent afib, who presents with a several wk h/o DOE, 3-4 day h/o LEE, and a new finding of R pleural effusion.  Problem List   Past Medical History  Diagnosis Date  . Osteoporosis   . Atrial Fibrillation and Flutter     a. chronic coumadin;  b. 10/2004 Echo: EF 55-65%, no rwma, mild AI, mild-mod TR.  . Glaucoma   . Osteoporosis   . Hard of hearing     bilateral hearing aids  . Pleural effusion, right     a. 05/2014   Past Surgical History  Procedure Laterality Date  . Fixation kyphoplasty      back  . Cholecystectomy    . Glaucoma surgery  09/17/12    Allergies  Allergies  Allergen Reactions  . Sulfa Antibiotics Anaphylaxis  . Risedronate Sodium     headaches    HPI  80 y/o female with the above problem list.  She has a h/o afib/flutter and is on chronic coumadin anticoagulation.  She lives by herself and generally does pretty well.  Over the past few mos, she has noted DOE when walking to and from her mailbox or with similar activities.  She has never had chest pain and does not usually have to stop her activities secondary to dyspnea but does have to slow her pace.  About 3-4 days ago, she began to experience bilateral lower extremity/ankle edema, which was new for her.  Her DOE has been stable and she has not had any pnd, orthopnea, n, v, dizziness, syncope, chest pain, or edema.  She was seen in coumadin clinic yesterday and mentioned her LEE and was advised to see her PCP.  She saw her PCP today and a CXR was obtained revealing a moderate R pleural effusion.  She was rx lasix 20mg  po daily and arrangements were made for f/u in our office today.  She has had no dyspnea at rest.  Home Medications  Prior to Admission medications   Medication Sig Start Date End Date Taking?  Authorizing Provider  calcium-vitamin D (OSCAL WITH D) 500-200 MG-UNIT per tablet Take 1 tablet by mouth daily.   Yes Historical Provider, MD  dorzolamide-timolol (COSOPT) 22.3-6.8 MG/ML ophthalmic solution 1 drop 2 (two) times daily.    Yes Historical Provider, MD  fluticasone (FLONASE) 50 MCG/ACT nasal spray Place 2 sprays into the nose as needed for rhinitis.   Yes Historical Provider, MD  Vitamin D, Ergocalciferol, (DRISDOL) 50000 UNITS CAPS Take 50,000 Units by mouth every 14 (fourteen) days.    Yes Historical Provider, MD  warfarin (COUMADIN) 5 MG tablet TAKE ONE-HALF (1/2) TABLET EVERY DAY OR AS DIRECTED BY COUMADIN CLINIC   Yes Lyn RecordsHenry W Smith III, MD  zoledronic acid (RECLAST) 5 MG/100ML SOLN Inject 5 mg into the vein once. Once a year.   Yes Historical Provider, MD  furosemide (LASIX) 20 MG tablet Take 1 tablet (20 mg total) by mouth daily. 06/02/14   Ok Anishristopher R Berge, NP    Review of Systems  DOE and lower ext edema as above.  She denies chest pain, palpitations, pnd, orthopnea, n, v, dizziness, syncope, or early satiety.  She does not weight herself @ home but believes that she runs around 88-90 lbs.  She is 93 lbs on our scale today.  All other systems reviewed and are otherwise negative except as noted above.  Physical Exam  Blood pressure 140/78, pulse 64, height 5' (1.524 m), weight 93 lb 6.4 oz (42.366 kg).  SPO2 93-96% with ambulation on room air. General: Pleasant, NAD Psych: Normal affect. Neuro: Alert and oriented X 3. Moves all extremities spontaneously. HEENT: Very HOH.  OTW nl.  Neck: Supple without bruits.  Neck veins are flat. Lungs:  Resp regular and unlabored, slightly diminished breath sounds in bilat bases but overall moves air well. Heart: IR, IR no s3, s4.  2/6 Syst murmur @ llsb. Abdomen: Soft, non-tender, non-distended, BS + x 4.  Extremities: No clubbing, cyanosis.  1-2+ bilat LEE to lower calf bilat. DP/PT/Radials 2+ and equal bilaterally.  Accessory  Clinical Findings  ECG - Afib, 64, lad, lafb, poor r prog.  No acute st/t changes.  Assessment & Plan  1.  R Pleural Effusion/DOE:  Pt presents with a several month h/o DOE with subsequent development of bilat LEE over the past 3-4 days.  CXR was done earlier today in her PCP's office and revealed a moderate sized R pleural effusion.  She has not had dyspnea at rest, pnd, orthopnea, or early satiety.  Her wt is about 3-5 lbs above where she would normally expect to be, though she does not weigh herself @ home regularly.  Her neck veins are flat and suprisingly, she moves air pretty well on exam.  I ambulated her in the office and she was able to maintain her O2 sats in the mid 90's on room air w/o significant dyspnea. She was rx lasix 20mg  by her PCP and I agree that an initial attempt at diuresis is worthwhile.  I discussed her case with Dr. Eden EmmsNishan and reviewed her CXR with him as well.  I will check a bmet and albumin today and will also arrange for repeat bmet, R lateral decubitus cxr, Echo, and office f/u for next Monday.  If Ss and effusion do not improve with gentle diuresis, we will then need to consider thoracic u/s to determine if thoracentesis is appropriate.  We would need to hold coumadin for thoracentesis.  She does not have a prior h/o CVA and should not require bridging.  2.  Permanent Afib:  Rate controlled w/o AVN blocking agent.  Chronically anticoagulated with coumadin and followed closely in coumadin clinic.  3.  Dispo:  BMET and Albumin today.  BMET, Echo, R lat decubitus CXR on Monday with office f/u that afternoon.  Nicolasa Duckinghristopher Berge, NP 06/02/2014, 2:58 PM

## 2014-06-02 NOTE — Patient Instructions (Signed)
Your physician has recommended you make the following change in your medication:    START TAKING LASIX 20 MG ONCE A DAY  90 DAY RX SENT IN TO PHARMACY   Your physician has requested that you have an echocardiogram. Echocardiography is a painless test that uses sound waves to create images of your heart. It provides your doctor with information about the size and shape of your heart and how well your heart's chambers and valves are working. This procedure takes approximately one hour. There are no restrictions for this procedure.  SCHEDULE  ON Monday OR EARLIER Tuesday NEXT WEEK   IF POSSIBLE   A chest x-ray takes a picture of the organs and structures inside the chest, including the heart, lungs, and blood vessels. This test can show several things, including, whether the heart is enlarges; whether fluid is building up in the lungs; and whether pacemaker / defibrillator leads are still in place. SCHEDULE  ON Monday NEXT WEEK  IF POSSIBLE    Your physician recommends that you return for lab work  TODAY BMET AND ALBUMIN   AND IN ONE WEEK FROM TODAY  A REPEAT BMET    Your physician recommends that you schedule a follow-up appointment WITH SCOTT WEAVER  OR LORI  AFTER CHEST XRAY AND ECHO

## 2014-06-02 NOTE — Telephone Encounter (Signed)
New message    Per kim patient need to be seen ASAP for sob. pleural effusion  In the office now.  Patient has appt with Ward Givenshris Berge on  10/27 @ 1:30 pm .    Office aware that Dr. Katrinka BlazingSmith is not in the office today.

## 2014-06-08 ENCOUNTER — Ambulatory Visit
Admission: RE | Admit: 2014-06-08 | Discharge: 2014-06-08 | Disposition: A | Discharge status: Home / Self Care | Payer: Medicare Other | Source: Ambulatory Visit | Attending: Nurse Practitioner | Admitting: Nurse Practitioner

## 2014-06-08 ENCOUNTER — Ambulatory Visit (INDEPENDENT_AMBULATORY_CARE_PROVIDER_SITE_OTHER): Payer: Medicare Other | Admitting: Physician Assistant

## 2014-06-08 ENCOUNTER — Other Ambulatory Visit: Payer: Self-pay | Admitting: Nurse Practitioner

## 2014-06-08 ENCOUNTER — Ambulatory Visit (HOSPITAL_COMMUNITY): Payer: Medicare Other | Attending: Nurse Practitioner | Admitting: Radiology

## 2014-06-08 ENCOUNTER — Encounter: Payer: Self-pay | Admitting: Physician Assistant

## 2014-06-08 ENCOUNTER — Other Ambulatory Visit (INDEPENDENT_AMBULATORY_CARE_PROVIDER_SITE_OTHER): Payer: Medicare Other | Admitting: *Deleted

## 2014-06-08 VITALS — BP 146/70 | HR 62 | Ht 59.0 in | Wt 90.0 lb

## 2014-06-08 DIAGNOSIS — I482 Chronic atrial fibrillation: Secondary | ICD-10-CM

## 2014-06-08 DIAGNOSIS — I4892 Unspecified atrial flutter: Secondary | ICD-10-CM

## 2014-06-08 DIAGNOSIS — J90 Pleural effusion, not elsewhere classified: Secondary | ICD-10-CM

## 2014-06-08 DIAGNOSIS — J948 Other specified pleural conditions: Secondary | ICD-10-CM | POA: Diagnosis not present

## 2014-06-08 DIAGNOSIS — I4821 Permanent atrial fibrillation: Secondary | ICD-10-CM | POA: Insufficient documentation

## 2014-06-08 LAB — BASIC METABOLIC PANEL
BUN: 21 mg/dL (ref 6–23)
CALCIUM: 9.4 mg/dL (ref 8.4–10.5)
CO2: 29 meq/L (ref 19–32)
Chloride: 98 mEq/L (ref 96–112)
Creatinine, Ser: 0.7 mg/dL (ref 0.4–1.2)
GFR: 90.55 mL/min (ref >60.00)
GLUCOSE: 89 mg/dL (ref 70–99)
Potassium: 4 mEq/L (ref 3.5–5.1)
SODIUM: 137 meq/L (ref 135–145)

## 2014-06-08 NOTE — Progress Notes (Signed)
Echocardiogram performed.  

## 2014-06-08 NOTE — Progress Notes (Signed)
[address] [place], [place]  [address] Phone: [phone]) [phone] Fax:  [phone]  Date:  06/08/2014   Patient ID:  Sarah MedicusFrances D [name], DOB 21-Dec-1919, MRN [id number]   PCP:  Ezequiel KayserPERINI,MARK A, MD  Cardiologist:  Dr. Katrinka BlazingSmith  History of Present Illness: Sarah PongFrances D [name] is a 80 y.o. female with history of persistent afib/flutter on chronic coumadin and minimal other PMH who presents for f/u of right pleural effusion. She lives by herself and has done fairly well with this. She no longer drives - her daughter looks after her and drives for her. She presented to her PCP last week with increasing DOE and LEE. CXR showed a moderate R pleural effusion.She was prescribed Lasix 20mg  daily and asked to follow up here. She saw Ward Givenshris Berge, NP on 06/02/14. POx was mid-90s. BMET that day was normal. Albumin was 3.2 but not likely to be the primary cause for her edema. The plan was that if symptoms did not improve with gentle diuresis, we would need to consider thoracentesis (for which Coumadin would need to be held). Today she had pre-arranged labs, CXR, and echo. BMET was completely normal and stable. CXR showed small amount of right pleural fluid with free-flowing component, mild cardiomegaly and possible mild vascular congestion. 2D echo is still pending.  She comes in feeling markedly better. Her daughter has noticed a big difference too. The patient is so pleased that her legs are back to normal. She is no longer dyspneic with exertion. She denies orthopnea, PND, chest pain, near syncope or syncope. She noted an initial increase in UOP when starting Lasix but this has come back to her usual amount. Weight is down to baseline and maintaining. POx 98% in clinic.  Recent Labs: 06/08/2014: Creatinine 0.7; Potassium 4.0  Wt Readings from Last 3 Encounters:  06/08/14 90 lb (40.824 kg)  06/02/14 93 lb 6.4 oz (42.366 kg)  11/25/13 90 lb 5 oz (40.965 kg)     Past Medical History  Diagnosis Date  .  Osteoporosis   . Atrial Fibrillation and Flutter     a. chronic coumadin;  b. 10/2004 Echo: EF 55-65%, no rwma, mild AI, mild-mod TR.  . Glaucoma   . Osteoporosis   . Hard of hearing     bilateral hearing aids  . Pleural effusion, right     a. 05/2014  . Permanent atrial fibrillation     Current Outpatient Prescriptions  Medication Sig Dispense Refill  . calcium-vitamin D (OSCAL WITH D) 500-200 MG-UNIT per tablet Take 1 tablet by mouth daily.    . dorzolamide-timolol (COSOPT) 22.3-6.8 MG/ML ophthalmic solution 1 drop 2 (two) times daily.     . fluticasone (FLONASE) 50 MCG/ACT nasal spray Place 2 sprays into the nose as needed for rhinitis.    . furosemide (LASIX) 20 MG tablet Take 1 tablet (20 mg total) by mouth daily. 90 tablet 3  . Vitamin D, Ergocalciferol, (DRISDOL) 50000 UNITS CAPS Take 50,000 Units by mouth every 14 (fourteen) days.     Marland Kitchen. warfarin (COUMADIN) 5 MG tablet TAKE ONE-HALF (1/2) TABLET EVERY DAY OR AS DIRECTED BY COUMADIN CLINIC 45 tablet 1  . zoledronic acid (RECLAST) 5 MG/100ML SOLN Inject 5 mg into the vein once. Once a year.     No current facility-administered medications for this visit.    Allergies:   Sulfa antibiotics and Risedronate sodium   Social History:  The patient  reports that she has never smoked. She does not  have any smokeless tobacco history on file. She reports that she drinks alcohol.   Family History:  The patient's family history includes Cancer in her father and mother.   ROS:  Please see the history of present illness.    All other systems reviewed and negative.   PHYSICAL EXAM:  VS:  BP 146/70 mmHg  Pulse 62  Ht 4\' 11"  (1.499 m)  Wt 90 lb (40.824 kg)  BMI 18.17 kg/m2 POx 98% RA Well nourished thin WF in no acute distress HEENT: normal Neck: no JVD Cardiac:  normal S1, S2; RRR; no murmur Lungs:  mildly decreased BS at bases, no wheezing, rhonchi or rales Abd: soft, nontender, no hepatomegaly Ext: no edema Skin: warm and  dry Neuro:  moves all extremities spontaneously, no focal abnormalities noted  ASSESSMENT AND PLAN:  1. Right pleural effusion/DOE suspected due to acute diastolic CHF - symptomatically much improved. POx is 98% in clinic today and she ambulated well without any dyspnea. LEE has resolved. Given the small amount of pleural fluid still present, will continue low-dose Lasix. I informed her and her daughter that if she ever runs into trouble with decreased oral intake or GI illness, she should call our office so we can discuss holding this to avoid dehydration. She is tolerating it well now and labs are stable. Salt restriction discussed. Unfortunately [employer] are an easy option for the patient. We gave her information on a lower-salt diet. I told them I think [employer] meal delivery service caters to low salt diets. She does not drink excess fluid. 2D echo still pending. 2. Permanent atrial fibrillation - continue Coumadin. Rate controlled. 3. Reported history of atrial flutter as well - continue Coumadin.  Dispo: F/u Dr. Katrinka BlazingSmith in December as planned.  Signed, Ronie Spiesayna Dunn, PA-C  06/08/2014 4:00 PM

## 2014-06-08 NOTE — Patient Instructions (Addendum)
Your physician recommends that you continue on your current medications as directed. Please refer to the Current Medications.   Your physician recommends that you schedule a follow-up appointment in: as already planned  Low-Sodium Eating Plan  Sodium raises blood pressure and causes water to be held in the body. Getting less sodium from food will help lower your blood pressure, reduce any swelling, and protect your heart, liver, and kidneys. We get sodium by adding salt (sodium chloride) to food. Most of our sodium comes from canned, boxed, and frozen foods. Restaurant foods, fast foods, and pizza are also very high in sodium. Even if you take medicine to lower your blood pressure or to reduce fluid in your body, getting less sodium from your food is important. WHAT IS MY PLAN? Most people should limit their sodium intake to 2,300 mg a day. Your health care provider recommends that you limit your sodium intake to __________ a day.  WHAT DO I NEED TO KNOW ABOUT THIS EATING PLAN? For the low-sodium eating plan, you will follow these general guidelines:  Choose foods with a % Daily Value for sodium of less than 5% (as listed on the food label).   Use salt-free seasonings or herbs instead of table salt or sea salt.   Check with your health care provider or pharmacist before using salt substitutes.   Eat fresh foods.  Eat more vegetables and fruits.  Limit canned vegetables. If you do use them, rinse them well to decrease the sodium.   Limit cheese to 1 oz (28 g) per day.   Eat lower-sodium products, often labeled as "lower sodium" or "no salt added."  Avoid foods that contain monosodium glutamate (MSG). MSG is sometimes added to [place] food and some canned foods.  Check food labels (Nutrition Facts labels) on foods to learn how much sodium is in one serving.  Eat more home-cooked food and less restaurant, buffet, and fast food.  When eating at a restaurant, ask that your food  be prepared with less salt or none, if possible.  HOW DO I READ FOOD LABELS FOR SODIUM INFORMATION? The Nutrition Facts label lists the amount of sodium in one serving of the food. If you eat more than one serving, you must multiply the listed amount of sodium by the number of servings. Food labels may also identify foods as:  Sodium free--Less than 5 mg in a serving.  Very low sodium--35 mg or less in a serving.  Low sodium--140 mg or less in a serving.  Light in sodium--50% less sodium in a serving. For example, if a food that usually has 300 mg of sodium is changed to become light in sodium, it will have 150 mg of sodium.  Reduced sodium--25% less sodium in a serving. For example, if a food that usually has 400 mg of sodium is changed to reduced sodium, it will have 300 mg of sodium. WHAT FOODS CAN I EAT? Grains Low-sodium cereals, including oats, puffed wheat and rice, and shredded wheat cereals. Low-sodium crackers. Unsalted rice and pasta. Lower-sodium bread.  Vegetables Frozen or fresh vegetables. Low-sodium or reduced-sodium canned vegetables. Low-sodium or reduced-sodium tomato sauce and paste. Low-sodium or reduced-sodium tomato and vegetable juices.  Fruits Fresh, frozen, and canned fruit. Fruit juice.  Meat and Other Protein Products Low-sodium canned tuna and salmon. Fresh or frozen meat, poultry, seafood, and fish. Lamb. Unsalted nuts. Dried beans, peas, and lentils without added salt. Unsalted canned beans. Homemade soups without salt. Eggs.  Dairy Milk.  Soy milk. Ricotta cheese. Low-sodium or reduced-sodium cheeses. Yogurt.  Condiments Fresh and dried herbs and spices. Salt-free seasonings. Onion and garlic powders. Low-sodium varieties of mustard and ketchup. Lemon juice.  Fats and Oils Reduced-sodium salad dressings. Unsalted butter.  Other Unsalted popcorn and pretzels.  The items listed above may not be a complete list of recommended foods or beverages.  Contact your dietitian for more options. WHAT FOODS ARE NOT RECOMMENDED? Grains Instant hot cereals. Bread stuffing, pancake, and biscuit mixes. Croutons. Seasoned rice or pasta mixes. Noodle soup cups. Boxed or frozen macaroni and cheese. Self-rising flour. Regular salted crackers. Vegetables Regular canned vegetables. Regular canned tomato sauce and paste. Regular tomato and vegetable juices. Frozen vegetables in sauces. Salted french fries. Olives. Rosita FirePickles. Relishes. Sauerkraut. Salsa. Meat and Other Protein Products Salted, canned, smoked, spiced, or pickled meats, seafood, or fish. Bacon, ham, sausage, hot dogs, corned beef, chipped beef, and packaged luncheon meats. Salt pork. Jerky. Pickled herring. Anchovies, regular canned tuna, and sardines. Salted nuts. Dairy Processed cheese and cheese spreads. Cheese curds. Blue cheese and cottage cheese. Buttermilk.  Condiments Onion and garlic salt, seasoned salt, table salt, and sea salt. Canned and packaged gravies. Worcestershire sauce. Tartar sauce. Barbecue sauce. Teriyaki sauce. Soy sauce, including reduced sodium. Steak sauce. Fish sauce. Oyster sauce. Cocktail sauce. Horseradish. Regular ketchup and mustard. Meat flavorings and tenderizers. Bouillon cubes. Hot sauce. Tabasco sauce. Marinades. Taco seasonings. Relishes. Fats and Oils Regular salad dressings. Salted butter. Margarine. Ghee. Bacon fat.  Other Potato and tortilla chips. Corn chips and puffs. Salted popcorn and pretzels. Canned or dried soups. Pizza. Frozen entrees and pot pies.  The items listed above may not be a complete list of foods and beverages to avoid. Contact your dietitian for more information. Document Released: 01/13/2002 Document Revised: 07/29/2013 Document Reviewed: 05/28/2013 [facility] Patient Information 2015 [employer], [place]. This information is not intended to replace advice given to you by your health care provider. Make sure you discuss any questions  you have with your health care provider.

## 2014-06-09 ENCOUNTER — Other Ambulatory Visit: Payer: Medicare Other

## 2014-06-12 ENCOUNTER — Other Ambulatory Visit: Payer: Self-pay | Admitting: Nurse Practitioner

## 2014-06-17 ENCOUNTER — Encounter: Payer: Self-pay | Admitting: Interventional Cardiology

## 2014-06-22 ENCOUNTER — Ambulatory Visit (INDEPENDENT_AMBULATORY_CARE_PROVIDER_SITE_OTHER): Payer: Medicare Other | Admitting: *Deleted

## 2014-06-22 ENCOUNTER — Encounter: Payer: Self-pay | Admitting: Interventional Cardiology

## 2014-06-22 DIAGNOSIS — Z5181 Encounter for therapeutic drug level monitoring: Secondary | ICD-10-CM

## 2014-06-22 DIAGNOSIS — I4891 Unspecified atrial fibrillation: Secondary | ICD-10-CM

## 2014-06-22 LAB — POCT INR: INR: 2

## 2014-07-13 ENCOUNTER — Ambulatory Visit: Payer: Medicare Other | Admitting: Interventional Cardiology

## 2014-07-21 ENCOUNTER — Ambulatory Visit (INDEPENDENT_AMBULATORY_CARE_PROVIDER_SITE_OTHER): Payer: Medicare Other

## 2014-07-21 DIAGNOSIS — I4891 Unspecified atrial fibrillation: Secondary | ICD-10-CM

## 2014-07-21 DIAGNOSIS — Z5181 Encounter for therapeutic drug level monitoring: Secondary | ICD-10-CM

## 2014-07-21 LAB — POCT INR: INR: 2.1

## 2014-08-11 ENCOUNTER — Encounter: Payer: Self-pay | Admitting: Interventional Cardiology

## 2014-08-11 ENCOUNTER — Ambulatory Visit (INDEPENDENT_AMBULATORY_CARE_PROVIDER_SITE_OTHER): Payer: Medicare Other | Admitting: *Deleted

## 2014-08-11 ENCOUNTER — Ambulatory Visit (INDEPENDENT_AMBULATORY_CARE_PROVIDER_SITE_OTHER): Payer: Medicare Other | Admitting: Interventional Cardiology

## 2014-08-11 VITALS — BP 142/72 | HR 68 | Ht 59.0 in | Wt 88.4 lb

## 2014-08-11 DIAGNOSIS — I4891 Unspecified atrial fibrillation: Secondary | ICD-10-CM

## 2014-08-11 DIAGNOSIS — I4892 Unspecified atrial flutter: Secondary | ICD-10-CM

## 2014-08-11 DIAGNOSIS — Z7901 Long term (current) use of anticoagulants: Secondary | ICD-10-CM

## 2014-08-11 DIAGNOSIS — J90 Pleural effusion, not elsewhere classified: Secondary | ICD-10-CM

## 2014-08-11 DIAGNOSIS — J948 Other specified pleural conditions: Secondary | ICD-10-CM

## 2014-08-11 DIAGNOSIS — Z5181 Encounter for therapeutic drug level monitoring: Secondary | ICD-10-CM

## 2014-08-11 LAB — BASIC METABOLIC PANEL
BUN: 24 mg/dL — ABNORMAL HIGH (ref 6–23)
CALCIUM: 9.5 mg/dL (ref 8.4–10.5)
CHLORIDE: 100 meq/L (ref 96–112)
CO2: 33 meq/L — AB (ref 19–32)
Creatinine, Ser: 0.8 mg/dL (ref 0.4–1.2)
GFR: 67.33 mL/min (ref >60.00)
Glucose, Bld: 119 mg/dL — ABNORMAL HIGH (ref 70–99)
Potassium: 4 mEq/L (ref 3.5–5.1)
Sodium: 140 mEq/L (ref 135–145)

## 2014-08-11 LAB — POCT INR: INR: 3.1

## 2014-08-11 NOTE — Patient Instructions (Signed)
Your physician recommends that you continue on your current medications as directed. Please refer to the Current Medication list given to you today.  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 Katrinka BlazingSmith. You will receive a reminder letter in the mail two months in advance. If you don't receive a letter, please call our office to schedule the follow-up appointment.

## 2014-08-11 NOTE — Progress Notes (Signed)
Patient ID: [name], female   DOB: [birth year], 81 y.o.   MRN: [id number]    [address] [place], [place]  [address] Phone: [phone] Fax:  [phone]  Date:  08/11/2014   ID:  [name], DOB 1920/05/27, MRN [id number]  PCP:  Ezequiel Kayser, MD   ASSESSMENT:  1.  Acute on chronic diastolic heart resolved on diuretic therapy 4 proximally 2 months 2. Persistent atrial fibrillation 3. Chronic anticoagulation therapy  PLAN:  1.  Basic metabolic panel to determine if the current diuretic regimen is appropriate  2. Clinical follow-up in one year  3. No change in medical regimen   SUBJECTIVE: [name] is a 81 y.o. female  Who has gradually improved after having Lasix started. She developed right pleural effusion, lower extremity swelling, and dyspnea in November. Distorted around the time that she found her sister deceased in her house. She was very stressed by the event. After that event she noted increasing fatigue and dyspnea. Evaluation included an echocardiogram that demonstrated preserved systolic function.   Wt Readings from Last 3 Encounters:  08/11/14 88 lb 6.4 oz (40.098 kg)  06/08/14 90 lb (40.824 kg)  06/02/14 93 lb 6.4 oz (42.366 kg)     Past Medical History  Diagnosis Date  . Osteoporosis   . Atrial Fibrillation and Flutter     a. chronic coumadin;  b. 10/2004 Echo: EF 55-65%, no rwma, mild AI, mild-mod TR.  . Glaucoma   . Osteoporosis   . Hard of hearing     bilateral hearing aids  . Pleural effusion, right     a. 05/2014  . Permanent atrial fibrillation     Current Outpatient Prescriptions  Medication Sig Dispense Refill  . calcium-vitamin D (OSCAL WITH D) 500-200 MG-UNIT per tablet Take 1 tablet by mouth daily.    . dorzolamide-timolol (COSOPT) 22.3-6.8 MG/ML ophthalmic solution 1 drop 2 (two) times daily.     . fluticasone (FLONASE) 50 MCG/ACT nasal spray Place 2 sprays into the nose as needed for rhinitis.    .  furosemide (LASIX) 20 MG tablet Take 1 tablet (20 mg total) by mouth daily. 90 tablet 3  . Vitamin D, Ergocalciferol, (DRISDOL) 50000 UNITS CAPS Take 50,000 Units by mouth every 14 (fourteen) days.     Marland Kitchen warfarin (COUMADIN) 5 MG tablet TAKE ONE-HALF (1/2) TABLET EVERY DAY OR AS DIRECTED BY COUMADIN CLINIC 45 tablet 1  . zoledronic acid (RECLAST) 5 MG/100ML SOLN Inject 5 mg into the vein once. Once a year.     No current facility-administered medications for this visit.    Allergies:    Allergies  Allergen Reactions  . Sulfa Antibiotics Anaphylaxis  . Risedronate Sodium     headaches    Social History:  The patient  reports that she has never smoked. She does not have any smokeless tobacco history on file. She reports that she drinks alcohol.   ROS:  Please see the history of present illness.    Appetite is stable. No blood in the urine or stool. No falls. No head trauma.   All other systems reviewed and negative.   OBJECTIVE: VS:  BP 142/72 mmHg  Pulse 68  Ht  (1.499 m)  Wt 88 lb 6.4 oz (40.098 kg)  BMI 17.85 kg/m2 Well nourished, well developed, in no acute distress,  Elderly and frail HEENT: normal Neck: JVD  flat. Carotid bruit  absent  Cardiac:  normal S1, S2;  IIRR; no murmur Lungs:  clear to auscultation bilaterally, no wheezing, rhonchi or rales Abd: soft, nontender, no hepatomegaly Ext: Edema  absent. Pulses  2+ Skin: warm and dry Neuro:  CNs 2-12 intact, no focal abnormalities noted  EKG:   Not repeated       Signed, Darci NeedleHenry W. B. Smith III, MD 08/11/2014 5:57 PM

## 2014-08-14 ENCOUNTER — Telehealth: Payer: Self-pay | Admitting: Interventional Cardiology

## 2014-08-14 NOTE — Telephone Encounter (Signed)
New message ° ° ° ° ° °Returning a nurses call °

## 2014-08-14 NOTE — Telephone Encounter (Signed)
Spoke with Donita Brooksindy Wood, patient's daughter and informed her of labs.

## 2014-08-26 ENCOUNTER — Ambulatory Visit (INDEPENDENT_AMBULATORY_CARE_PROVIDER_SITE_OTHER): Payer: Medicare Other | Admitting: *Deleted

## 2014-08-26 DIAGNOSIS — Z5181 Encounter for therapeutic drug level monitoring: Secondary | ICD-10-CM

## 2014-08-26 DIAGNOSIS — I4891 Unspecified atrial fibrillation: Secondary | ICD-10-CM

## 2014-08-26 LAB — POCT INR: INR: 2.1

## 2014-09-15 ENCOUNTER — Ambulatory Visit (INDEPENDENT_AMBULATORY_CARE_PROVIDER_SITE_OTHER): Payer: Medicare Other | Admitting: *Deleted

## 2014-09-15 DIAGNOSIS — I4891 Unspecified atrial fibrillation: Secondary | ICD-10-CM

## 2014-09-15 DIAGNOSIS — Z5181 Encounter for therapeutic drug level monitoring: Secondary | ICD-10-CM

## 2014-09-15 LAB — POCT INR: INR: 2.6

## 2014-10-13 ENCOUNTER — Ambulatory Visit (INDEPENDENT_AMBULATORY_CARE_PROVIDER_SITE_OTHER): Payer: Medicare Other | Admitting: Pharmacist

## 2014-10-13 DIAGNOSIS — Z5181 Encounter for therapeutic drug level monitoring: Secondary | ICD-10-CM

## 2014-10-13 DIAGNOSIS — I4891 Unspecified atrial fibrillation: Secondary | ICD-10-CM | POA: Diagnosis not present

## 2014-10-13 LAB — POCT INR: INR: 2.9

## 2014-11-06 ENCOUNTER — Other Ambulatory Visit: Payer: Self-pay | Admitting: Interventional Cardiology

## 2014-11-17 ENCOUNTER — Other Ambulatory Visit (HOSPITAL_COMMUNITY): Payer: Self-pay | Admitting: Internal Medicine

## 2014-11-19 ENCOUNTER — Encounter (HOSPITAL_COMMUNITY): Payer: Medicare Other

## 2014-11-24 ENCOUNTER — Encounter (HOSPITAL_COMMUNITY): Payer: Self-pay

## 2014-11-24 ENCOUNTER — Ambulatory Visit (INDEPENDENT_AMBULATORY_CARE_PROVIDER_SITE_OTHER): Payer: Medicare Other | Admitting: Pharmacist Clinician (PhC)/ Clinical Pharmacy Specialist

## 2014-11-24 ENCOUNTER — Inpatient Hospital Stay (HOSPITAL_COMMUNITY): Admission: RE | Admit: 2014-11-24 | Payer: Medicare Other | Source: Ambulatory Visit

## 2014-11-24 ENCOUNTER — Ambulatory Visit (HOSPITAL_COMMUNITY)
Admission: RE | Admit: 2014-11-24 | Discharge: 2014-11-24 | Disposition: A | Discharge status: Home / Self Care | Payer: Medicare Other | Source: Ambulatory Visit | Attending: Internal Medicine | Admitting: Internal Medicine

## 2014-11-24 DIAGNOSIS — Z5181 Encounter for therapeutic drug level monitoring: Secondary | ICD-10-CM | POA: Diagnosis not present

## 2014-11-24 DIAGNOSIS — I4891 Unspecified atrial fibrillation: Secondary | ICD-10-CM | POA: Diagnosis not present

## 2014-11-24 DIAGNOSIS — M810 Age-related osteoporosis without current pathological fracture: Secondary | ICD-10-CM | POA: Insufficient documentation

## 2014-11-24 LAB — POCT INR: INR: 2.3

## 2014-11-24 MED ORDER — ZOLEDRONIC ACID 5 MG/100ML IV SOLN
5.0000 mg | Freq: Once | INTRAVENOUS | Status: AC
  Administered 2014-11-24: 5 mg via INTRAVENOUS
  Filled 2014-11-24: qty 100

## 2014-11-24 MED ORDER — SODIUM CHLORIDE 0.9 % IV SOLN
INTRAVENOUS | Status: DC
  Administered 2014-11-24: 250 mL via INTRAVENOUS

## 2014-11-24 NOTE — Discharge Instructions (Signed)
RECLAST °Zoledronic Acid injection (Paget's Disease, Osteoporosis) °What is this medicine? °ZOLEDRONIC ACID (ZOE le dron ik AS id) lowers the amount of calcium loss from bone. It is used to treat Paget's disease and osteoporosis in women. °This medicine may be used for other purposes; ask your health care provider or pharmacist if you have questions. °COMMON BRAND NAME(S): Reclast, Zometa °What should I tell my health care provider before I take this medicine? °They need to know if you have any of these conditions: °-aspirin-sensitive asthma °-cancer, especially if you are receiving medicines used to treat cancer °-dental disease or wear dentures °-infection °-kidney disease °-low levels of calcium in the blood °-past surgery on the parathyroid gland or intestines °-receiving corticosteroids like dexamethasone or prednisone °-an unusual or allergic reaction to zoledronic acid, other medicines, foods, dyes, or preservatives °-pregnant or trying to get pregnant °-breast-feeding °How should I use this medicine? °This medicine is for infusion into a vein. It is given by a health care professional in a hospital or clinic setting. °Talk to your pediatrician regarding the use of this medicine in children. This medicine is not approved for use in children. °Overdosage: If you think you have taken too much of this medicine contact a poison control center or emergency room at once. °NOTE: This medicine is only for you. Do not share this medicine with others. °What if I miss a dose? °It is important not to miss your dose. Call your doctor or health care professional if you are unable to keep an appointment. °What may interact with this medicine? °-certain antibiotics given by injection °-NSAIDs, medicines for pain and inflammation, like ibuprofen or naproxen °-some diuretics like bumetanide, furosemide °-teriparatide °This list may not describe all possible interactions. Give your health care provider a list of all the  medicines, herbs, non-prescription drugs, or dietary supplements you use. Also tell them if you smoke, drink alcohol, or use illegal drugs. Some items may interact with your medicine. °What should I watch for while using this medicine? °Visit your doctor or health care professional for regular checkups. It may be some time before you see the benefit from this medicine. Do not stop taking your medicine unless your doctor tells you to. Your doctor may order blood tests or other tests to see how you are doing. °Women should inform their doctor if they wish to become pregnant or think they might be pregnant. There is a potential for serious side effects to an unborn child. Talk to your health care professional or pharmacist for more information. °You should make sure that you get enough calcium and vitamin D while you are taking this medicine. Discuss the foods you eat and the vitamins you take with your health care professional. °Some people who take this medicine have severe bone, joint, and/or muscle pain. This medicine may also increase your risk for jaw problems or a broken thigh bone. Tell your doctor right away if you have severe pain in your jaw, bones, joints, or muscles. Tell your doctor if you have any pain that does not go away or that gets worse. °Tell your dentist and dental surgeon that you are taking this medicine. You should not have major dental surgery while on this medicine. See your dentist to have a dental exam and fix any dental problems before starting this medicine. Take good care of your teeth while on this medicine. Make sure you see your dentist for regular follow-up appointments. °What side effects may I notice from receiving this   medicine? °Side effects that you should report to your doctor or health care professional as soon as possible: °-allergic reactions like skin rash, itching or hives, swelling of the face, lips, or tongue °-anxiety, confusion, or depression °-breathing  problems °-changes in vision °-eye pain °-feeling faint or lightheaded, falls °-jaw pain, especially after dental work °-mouth sores °-muscle cramps, stiffness, or weakness °-trouble passing urine or change in the amount of urine °Side effects that usually do not require medical attention (report to your doctor or health care professional if they continue or are bothersome): °-bone, joint, or muscle pain °-constipation °-diarrhea °-fever °-hair loss °-irritation at site where injected °-loss of appetite °-nausea, vomiting °-stomach upset °-trouble sleeping °-trouble swallowing °-weak or tired °This list may not describe all possible side effects. Call your doctor for medical advice about side effects. You may report side effects to FDA at 1-800-FDA-1088. °Where should I keep my medicine? °This drug is given in a hospital or clinic and will not be stored at home. °NOTE: This sheet is a summary. It may not cover all possible information. If you have questions about this medicine, talk to your doctor, pharmacist, or health care provider. °© 2015, [employer]. (2013-01-06 10:03:48) °Osteoporosis °Throughout your life, your body breaks down old bone and replaces it with new bone. As you get older, your body does not replace bone as quickly as it breaks it down. By the age of 30 years, most people begin to gradually lose bone because of the imbalance between bone loss and replacement. Some people lose more bone than others. Bone loss beyond a specified normal degree is considered osteoporosis.  °Osteoporosis affects the strength and durability of your bones. The inside of the ends of your bones and your flat bones, like the bones of your pelvis, look like honeycomb, filled with tiny open spaces. As bone loss occurs, your bones become less dense. This means that the open spaces inside your bones become bigger and the walls between these spaces become thinner. This makes your bones weaker. Bones of a person with  osteoporosis can become so weak that they can break (fracture) during minor accidents, such as a simple fall. °CAUSES  °The following factors have been associated with the development of osteoporosis: °· Smoking. °· Drinking more than 2 alcoholic drinks several days per week. °· Long-term use of certain medicines: °¨ Corticosteroids. °¨ Chemotherapy medicines. °¨ Thyroid medicines. °¨ Antiepileptic medicines. °¨ Gonadal hormone suppression medicine. °¨ Immunosuppression medicine. °· Being underweight. °· Lack of physical activity. °· Lack of exposure to the sun. This can lead to vitamin D deficiency. °· Certain medical conditions: °¨ Certain inflammatory bowel diseases, such as Crohn disease and ulcerative colitis. °¨ Diabetes. °¨ Hyperthyroidism. °¨ Hyperparathyroidism. °RISK FACTORS °Anyone can develop osteoporosis. However, the following factors can increase your risk of developing osteoporosis: °· Gender--Women are at higher risk than men. °· Age--Being older than 50 years increases your risk. °· Ethnicity--White and Asian people have an increased risk. °· Weight --Being extremely underweight can increase your risk of osteoporosis. °· Family history of osteoporosis--Having a family member who has developed osteoporosis can increase your risk. °SYMPTOMS  °Usually, people with osteoporosis have no symptoms.  °DIAGNOSIS  °Signs during a physical exam that may prompt your caregiver to suspect osteoporosis include: °· Decreased height. This is usually caused by the compression of the bones that form your spine (vertebrae) because they have weakened and become fractured. °· A curving or rounding of the upper back (kyphosis). °  To confirm signs of osteoporosis, your caregiver may request a procedure that uses 2 low-dose X-ray beams with different levels of energy to measure your bone mineral density (dual-energy X-ray absorptiometry [DXA]). Also, your caregiver may check your level of vitamin D. °TREATMENT  °The goal of  osteoporosis treatment is to strengthen bones in order to decrease the risk of bone fractures. There are different types of medicines available to help achieve this goal. Some of these medicines work by slowing the processes of bone loss. Some medicines work by increasing bone density. Treatment also involves making sure that your levels of calcium and vitamin D are adequate. °PREVENTION  °There are things you can do to help prevent osteoporosis. Adequate intake of calcium and vitamin D can help you achieve optimal bone mineral density. Regular exercise can also help, especially resistance and weight-bearing activities. If you smoke, quitting smoking is an important part of osteoporosis prevention. °MAKE SURE YOU: °· Understand these instructions. °· Will watch your condition. °· Will get help right away if you are not doing well or get worse. °FOR MORE INFORMATION °www.osteo.org and www.nof.org °Document Released: 05/03/2005 Document Revised: 11/18/2012 Document Reviewed: 07/08/2011 °ExitCare® Patient Information ©2015 [employer], [employer]. This information is not intended to replace advice given to you by your health care provider. Make sure you discuss any questions you have with your health care provider. ° ° °

## 2014-11-27 ENCOUNTER — Inpatient Hospital Stay (HOSPITAL_COMMUNITY): Admission: RE | Admit: 2014-11-27 | Payer: Medicare Other | Source: Ambulatory Visit

## 2015-01-05 ENCOUNTER — Ambulatory Visit (INDEPENDENT_AMBULATORY_CARE_PROVIDER_SITE_OTHER): Payer: Medicare Other | Admitting: *Deleted

## 2015-01-05 DIAGNOSIS — I4891 Unspecified atrial fibrillation: Secondary | ICD-10-CM

## 2015-01-05 DIAGNOSIS — Z5181 Encounter for therapeutic drug level monitoring: Secondary | ICD-10-CM

## 2015-01-05 LAB — POCT INR: INR: 2.5

## 2015-02-15 NOTE — Progress Notes (Signed)
Cardiology Office Note   Date:  02/16/2015   ID:  [name], DOB 1920/04/24, MRN [id number]  PCP:  Ezequiel Kayser, MD  Cardiologist:  Lesleigh Noe, MD   Chief Complaint  Patient presents with  . Congestive Heart Failure  . Atrial Fibrillation      History of Present Illness: [name] is a 81 y.o. female who presents for  Chronic diastolic heart failure an persistent atrial fibrillation. Chronic anticoagulation therapy.   The patient is accompanied by her daughter. She still lives independently at home. She denies lower extremity swelling. She has occasional flutter that she can feel in her chest but no significant dyspnea or prolonged palpitation. She denies blood in the urine and stool. She has not fallen or had head trauma.    Past Medical History  Diagnosis Date  . Osteoporosis   . Atrial Fibrillation and Flutter     a. chronic coumadin;  b. 10/2004 Echo: EF 55-65%, no rwma, mild AI, mild-mod TR.  . Glaucoma   . Osteoporosis   . Hard of hearing     bilateral hearing aids  . Pleural effusion, right     a. 05/2014  . Permanent atrial fibrillation     Past Surgical History  Procedure Laterality Date  . Fixation kyphoplasty      back  . Cholecystectomy    . Glaucoma surgery  09/17/12     Current Outpatient Prescriptions  Medication Sig Dispense Refill  . calcium-vitamin D (OSCAL WITH D) 500-200 MG-UNIT per tablet Take 1 tablet by mouth daily.    . dorzolamide-timolol (COSOPT) 22.3-6.8 MG/ML ophthalmic solution 1 drop 2 (two) times daily.     . fluticasone (FLONASE) 50 MCG/ACT nasal spray Place 2 sprays into the nose as needed for rhinitis.    . furosemide (LASIX) 20 MG tablet Take 1 tablet (20 mg total) by mouth daily. 90 tablet 3  . Vitamin D, Ergocalciferol, (DRISDOL) 50000 UNITS CAPS Take 50,000 Units by mouth every 14 (fourteen) days.     Marland Kitchen warfarin (COUMADIN) 5 MG tablet TAKE ONE-HALF (1/2) TABLET EVERY DAY OR AS DIRECTED BY COUMADIN  CLINIC 45 tablet 1  . zoledronic acid (RECLAST) 5 MG/100ML SOLN Inject 5 mg into the vein once. Once a year.     No current facility-administered medications for this visit.    Allergies:   Sulfa antibiotics and Risedronate sodium    Social History:  The patient  reports that she has never smoked. She has never used smokeless tobacco. She reports that she drinks alcohol.   Family History:  The patient's family history includes Cancer in her father and mother.    ROS:  Please see the history of present illness.   Otherwise, review of systems are positive for none.   All other systems are reviewed and negative.    PHYSICAL EXAM: VS:  BP 138/72 mmHg  Pulse 60  Ht 5' (1.524 m)  Wt 38.284 kg (84 lb 6.4 oz)  BMI 16.48 kg/m2  SpO2 98% , BMI Body mass index is 16.48 kg/(m^2). GEN: Well nourished, well developed, in no acute distress HEENT: normal Neck: no JVD, carotid bruits, or masses Cardiac: IIRR; no murmurs, rubs, or gallops,no edema  Respiratory:  clear to auscultation bilaterally, normal work of breathing GI: soft, nontender, nondistended, + BS MS: no deformity or atrophy Skin: warm and dry, no rash Neuro:  Strength and sensation are intact Psych: euthymic mood, full affect   EKG:  EKG  is not ordered today.    Recent Labs: 08/11/2014: BUN 24*; Creatinine, Ser 0.8; Potassium 4.0; Sodium 140    Lipid Panel No results found for: CHOL, TRIG, HDL, CHOLHDL, VLDL, LDLCALC, LDLDIRECT    Wt Readings from Last 3 Encounters:  02/16/15 38.284 kg (84 lb 6.4 oz)  11/24/14 38.216 kg (84 lb 4 oz)  08/11/14 40.098 kg (88 lb 6.4 oz)      Other studies Reviewed: Additional studies/ records that were reviewed today include: .    ASSESSMENT AND PLAN:  1. Permanent atrial fibrillation  controlled rate  2. Long term current use of anticoagulant therapy  no bleeding on anticoagulation therapy  3.  Chronic diastolic heart failure  no evidence of volume  overload     Current medicines are reviewed at length with the patient today.  The patient does not have concerns regarding medicines.  The following changes have been made:  no change  Labs/ tests ordered today include:  No orders of the defined types were placed in this encounter.     Disposition:   FU with HS in 7 months  Signed, Lesleigh NoeSMITH III,HENRY W, MD  02/16/2015 5:34 PM    [facility] [address], [place], [place]  [address] Phone: [phone]; Fax: [phone]

## 2015-02-16 ENCOUNTER — Ambulatory Visit (INDEPENDENT_AMBULATORY_CARE_PROVIDER_SITE_OTHER): Payer: Medicare Other | Admitting: Interventional Cardiology

## 2015-02-16 ENCOUNTER — Encounter: Payer: Self-pay | Admitting: Interventional Cardiology

## 2015-02-16 ENCOUNTER — Ambulatory Visit (INDEPENDENT_AMBULATORY_CARE_PROVIDER_SITE_OTHER): Payer: Medicare Other | Admitting: *Deleted

## 2015-02-16 VITALS — BP 138/72 | HR 60 | Ht 60.0 in | Wt 84.4 lb

## 2015-02-16 DIAGNOSIS — I4891 Unspecified atrial fibrillation: Secondary | ICD-10-CM | POA: Diagnosis not present

## 2015-02-16 DIAGNOSIS — Z7901 Long term (current) use of anticoagulants: Secondary | ICD-10-CM | POA: Diagnosis not present

## 2015-02-16 DIAGNOSIS — I5032 Chronic diastolic (congestive) heart failure: Secondary | ICD-10-CM

## 2015-02-16 DIAGNOSIS — I482 Chronic atrial fibrillation: Secondary | ICD-10-CM

## 2015-02-16 DIAGNOSIS — Z5181 Encounter for therapeutic drug level monitoring: Secondary | ICD-10-CM

## 2015-02-16 DIAGNOSIS — I4821 Permanent atrial fibrillation: Secondary | ICD-10-CM

## 2015-02-16 LAB — POCT INR: INR: 2.2

## 2015-02-16 NOTE — Patient Instructions (Signed)
Medication Instructions:  Your physician recommends that you continue on your current medications as directed. Please refer to the Current Medication list given to you today.   Labwork: None   Testing/Procedures: None   Follow-Up: Your physician wants you to follow-up in: 6-9 months with Dr.Smith You will receive a reminder letter in the mail two months in advance. If you don't receive a letter, please call our office to schedule the follow-up appointment.   Any Other Special Instructions Will Be Listed Below (If Applicable).   

## 2015-02-25 ENCOUNTER — Encounter (HOSPITAL_COMMUNITY): Payer: Self-pay | Admitting: *Deleted

## 2015-02-25 ENCOUNTER — Observation Stay (HOSPITAL_COMMUNITY): Payer: Medicare Other

## 2015-02-25 ENCOUNTER — Emergency Department (HOSPITAL_COMMUNITY): Payer: Medicare Other

## 2015-02-25 ENCOUNTER — Inpatient Hospital Stay (HOSPITAL_COMMUNITY)
Admission: EM | Admit: 2015-02-25 | Discharge: 2015-02-28 | DRG: 871 | Disposition: A | Discharge status: Home Health | Payer: Medicare Other | Attending: Internal Medicine | Admitting: Internal Medicine

## 2015-02-25 DIAGNOSIS — A419 Sepsis, unspecified organism: Secondary | ICD-10-CM | POA: Diagnosis not present

## 2015-02-25 DIAGNOSIS — J189 Pneumonia, unspecified organism: Secondary | ICD-10-CM | POA: Diagnosis present

## 2015-02-25 DIAGNOSIS — H409 Unspecified glaucoma: Secondary | ICD-10-CM | POA: Diagnosis present

## 2015-02-25 DIAGNOSIS — J181 Lobar pneumonia, unspecified organism: Secondary | ICD-10-CM

## 2015-02-25 DIAGNOSIS — M810 Age-related osteoporosis without current pathological fracture: Secondary | ICD-10-CM | POA: Diagnosis present

## 2015-02-25 DIAGNOSIS — I4821 Permanent atrial fibrillation: Secondary | ICD-10-CM

## 2015-02-25 DIAGNOSIS — R112 Nausea with vomiting, unspecified: Secondary | ICD-10-CM | POA: Diagnosis not present

## 2015-02-25 DIAGNOSIS — T45515A Adverse effect of anticoagulants, initial encounter: Secondary | ICD-10-CM | POA: Diagnosis present

## 2015-02-25 DIAGNOSIS — R0600 Dyspnea, unspecified: Secondary | ICD-10-CM

## 2015-02-25 DIAGNOSIS — Z7901 Long term (current) use of anticoagulants: Secondary | ICD-10-CM

## 2015-02-25 DIAGNOSIS — R55 Syncope and collapse: Secondary | ICD-10-CM | POA: Diagnosis present

## 2015-02-25 DIAGNOSIS — R05 Cough: Secondary | ICD-10-CM

## 2015-02-25 DIAGNOSIS — D6959 Other secondary thrombocytopenia: Secondary | ICD-10-CM | POA: Diagnosis present

## 2015-02-25 DIAGNOSIS — K2970 Gastritis, unspecified, without bleeding: Secondary | ICD-10-CM | POA: Diagnosis present

## 2015-02-25 DIAGNOSIS — I5032 Chronic diastolic (congestive) heart failure: Secondary | ICD-10-CM | POA: Diagnosis present

## 2015-02-25 DIAGNOSIS — Z882 Allergy status to sulfonamides status: Secondary | ICD-10-CM

## 2015-02-25 DIAGNOSIS — R059 Cough, unspecified: Secondary | ICD-10-CM

## 2015-02-25 DIAGNOSIS — J690 Pneumonitis due to inhalation of food and vomit: Secondary | ICD-10-CM | POA: Diagnosis present

## 2015-02-25 DIAGNOSIS — I482 Chronic atrial fibrillation: Secondary | ICD-10-CM | POA: Diagnosis not present

## 2015-02-25 DIAGNOSIS — H9190 Unspecified hearing loss, unspecified ear: Secondary | ICD-10-CM | POA: Diagnosis present

## 2015-02-25 LAB — COMPREHENSIVE METABOLIC PANEL
ALK PHOS: 95 U/L (ref 38–126)
ALT: 17 U/L (ref 14–54)
AST: 31 U/L (ref 15–41)
Albumin: 3.5 g/dL (ref 3.5–5.0)
Anion gap: 8 (ref 5–15)
BILIRUBIN TOTAL: 1.1 mg/dL (ref 0.3–1.2)
BUN: 26 mg/dL — AB (ref 6–20)
CALCIUM: 9.2 mg/dL (ref 8.9–10.3)
CHLORIDE: 101 mmol/L (ref 101–111)
CO2: 31 mmol/L (ref 22–32)
Creatinine, Ser: 0.75 mg/dL (ref 0.44–1.00)
GFR calc non Af Amer: >60 mL/min (ref >60)
Glucose, Bld: 132 mg/dL — ABNORMAL HIGH (ref 65–99)
Potassium: 4.3 mmol/L (ref 3.5–5.1)
Sodium: 140 mmol/L (ref 135–145)
TOTAL PROTEIN: 6.3 g/dL — AB (ref 6.5–8.1)

## 2015-02-25 LAB — URINE MICROSCOPIC-ADD ON

## 2015-02-25 LAB — PROTIME-INR
INR: 2.66 — ABNORMAL HIGH (ref 0.00–1.49)
Prothrombin Time: 27.9 seconds — ABNORMAL HIGH (ref 11.6–15.2)

## 2015-02-25 LAB — URINALYSIS, ROUTINE W REFLEX MICROSCOPIC
Bilirubin Urine: NEGATIVE
Glucose, UA: NEGATIVE mg/dL
KETONES UR: NEGATIVE mg/dL
Leukocytes, UA: NEGATIVE
Nitrite: NEGATIVE
PH: 6.5 (ref 5.0–8.0)
Protein, ur: 30 mg/dL — AB
SPECIFIC GRAVITY, URINE: 1.015 (ref 1.005–1.030)
UROBILINOGEN UA: 0.2 mg/dL (ref 0.0–1.0)

## 2015-02-25 LAB — CBC WITH DIFFERENTIAL/PLATELET
Basophils Absolute: 0 10*3/uL (ref 0.0–0.1)
Basophils Relative: 0 % (ref 0–1)
EOS PCT: 1 % (ref 0–5)
Eosinophils Absolute: 0.1 10*3/uL (ref 0.0–0.7)
HEMATOCRIT: 39.3 % (ref 36.0–46.0)
HEMOGLOBIN: 12.6 g/dL (ref 12.0–15.0)
LYMPHS PCT: 4 % — AB (ref 12–46)
Lymphs Abs: 0.4 10*3/uL — ABNORMAL LOW (ref 0.7–4.0)
MCH: 30.6 pg (ref 26.0–34.0)
MCHC: 32.1 g/dL (ref 30.0–36.0)
MCV: 95.4 fL (ref 78.0–100.0)
MONOS PCT: 6 % (ref 3–12)
Monocytes Absolute: 0.6 10*3/uL (ref 0.1–1.0)
Neutro Abs: 8.5 10*3/uL — ABNORMAL HIGH (ref 1.7–7.7)
Neutrophils Relative %: 89 % — ABNORMAL HIGH (ref 43–77)
PLATELETS: 114 10*3/uL — AB (ref 150–400)
RBC: 4.12 MIL/uL (ref 3.87–5.11)
RDW: 14.2 % (ref 11.5–15.5)
WBC: 9.6 10*3/uL (ref 4.0–10.5)

## 2015-02-25 LAB — TROPONIN I: Troponin I: <0.03 ng/mL (ref <0.031)

## 2015-02-25 MED ORDER — WARFARIN - PHARMACIST DOSING INPATIENT: Freq: Every day | Status: DC

## 2015-02-25 MED ORDER — WARFARIN SODIUM 2.5 MG PO TABS
2.5000 mg | ORAL_TABLET | ORAL | Status: DC
  Administered 2015-02-25: 2.5 mg via ORAL
  Filled 2015-02-25: qty 1

## 2015-02-25 MED ORDER — CETYLPYRIDINIUM CHLORIDE 0.05 % MT LIQD
7.0000 mL | Freq: Two times a day (BID) | OROMUCOSAL | Status: DC
  Administered 2015-02-25 – 2015-02-26 (×3): 7 mL via OROMUCOSAL

## 2015-02-25 MED ORDER — CALCIUM CARBONATE-VITAMIN D 500-200 MG-UNIT PO TABS
1.0000 | ORAL_TABLET | Freq: Every day | ORAL | Status: DC
  Administered 2015-02-26 – 2015-02-27 (×2): 1 via ORAL
  Filled 2015-02-25 (×3): qty 1

## 2015-02-25 MED ORDER — ZOLEDRONIC ACID 5 MG/100ML IV SOLN: 5.0000 mg | Freq: Once | INTRAVENOUS | Status: DC

## 2015-02-25 MED ORDER — FLUTICASONE PROPIONATE 50 MCG/ACT NA SUSP: 2.0000 | Freq: Every day | NASAL | Status: DC | PRN

## 2015-02-25 MED ORDER — WARFARIN 1.25 MG HALF TABLET
1.2500 mg | ORAL_TABLET | ORAL | Status: DC
  Filled 2015-02-25: qty 1

## 2015-02-25 MED ORDER — DORZOLAMIDE HCL-TIMOLOL MAL 2-0.5 % OP SOLN
1.0000 [drp] | Freq: Two times a day (BID) | OPHTHALMIC | Status: DC
  Administered 2015-02-25 – 2015-02-28 (×6): 1 [drp] via OPHTHALMIC
  Filled 2015-02-25: qty 10

## 2015-02-25 NOTE — ED Provider Notes (Signed)
1600 - 10F here after a syncopal event. Was making her bed, vomited multiple times, then passed out. Feeling well now. Chronic Afib, anticoagulated on coumadin. Labs unremarkable. Head CT ok. Will admit.  1. Syncope, unspecified syncope type      Elwin Mocha, MD 02/25/15 [phone]

## 2015-02-25 NOTE — ED Notes (Signed)
Pt off unit with xray 

## 2015-02-25 NOTE — Progress Notes (Signed)
ANTICOAGULATION CONSULT NOTE - Initial Consult  Pharmacy Consult for Coumadin Indication: atrial fibrillation  Allergies  Allergen Reactions  . Sulfa Antibiotics Anaphylaxis  . Risedronate Sodium Other (See Comments)    headaches    Patient Measurements:   Heparin Dosing Weight:    Vital Signs: Temp: 98.1 F (36.7 C) (07/21 1334) Temp Source: Oral (07/21 1334) BP: 150/65 mmHg (07/21 1945) Pulse Rate: 75 (07/21 1945)  Labs:  Recent Labs  02/25/15 1445  HGB 12.6  HCT 39.3  PLT 114*  LABPROT 27.9*  INR 2.66*  CREATININE 0.75  TROPONINI <0.03    Estimated Creatinine Clearance: 26.6 mL/min (by C-G formula based on Cr of 0.75).   Medical History: Past Medical History  Diagnosis Date  . Osteoporosis   . Atrial Fibrillation and Flutter     a. chronic coumadin;  b. 10/2004 Echo: EF 55-65%, no rwma, mild AI, mild-mod TR.  . Glaucoma   . Osteoporosis   . Hard of hearing     bilateral hearing aids  . Pleural effusion, right     a. 05/2014  . Permanent atrial fibrillation     Medications:  Prescriptions prior to admission  Medication Sig Dispense Refill Last Dose  . calcium-vitamin D (OSCAL WITH D) 500-200 MG-UNIT per tablet Take 1 tablet by mouth daily.   02/24/2015 at Unknown time  . dorzolamide-timolol (COSOPT) 22.3-6.8 MG/ML ophthalmic solution Place 1 drop into both eyes 2 (two) times daily.    02/25/2015 at Unknown time  . fluticasone (FLONASE) 50 MCG/ACT nasal spray Place 2 sprays into the nose as needed for rhinitis.   4 months  . furosemide (LASIX) 20 MG tablet Take 1 tablet (20 mg total) by mouth daily. 90 tablet 3 02/25/2015 at Unknown time  . Vitamin D, Ergocalciferol, (DRISDOL) 50000 UNITS CAPS Take 50,000 Units by mouth every 14 (fourteen) days.    02/24/2015  . warfarin (COUMADIN) 5 MG tablet TAKE ONE-HALF (1/2) TABLET EVERY DAY OR AS DIRECTED BY COUMADIN CLINIC (Patient taking differently: 1.25 mg on Friday, 2.5 mg all other days every evening.) 45 tablet  1 02/24/2015 at Unknown time  . zoledronic acid (RECLAST) 5 MG/100ML SOLN Inject 5 mg into the vein once. Once a year.   11/20/2014    Assessment: syncope  81 y/o F presents with N/V and LOC during breakfast. +Afib on chronic Coumadin. Head CT negative. CBC WNL. Admit INR 2.66 on 2.5mg  daily except 1.25mg  on Fridays. Admit for syncope of UKO.  Goal of Therapy:  INR 2-3 Monitor platelets by anticoagulation protocol: Yes   Plan:  Continue home regimen of 2.5mg  daily except 1.25mg  on Fridays Daily INR  Josia Cueva S. Merilynn Finland, PharmD, BCPS Clinical Staff Pharmacist Pager [phone]  [name] Stillinger 02/25/2015,8:05 PM

## 2015-02-25 NOTE — ED Notes (Signed)
Pt back from CT

## 2015-02-25 NOTE — ED Notes (Signed)
Pt was eating breakfast this AM, Cheerios, and started feeling nauseated.  Pt vomited multiple times mostly green flem.  Pt daughter witnessed pt slide to the ground and states she has a brief LOC.  Pt states she passed out, denies hitting head or hurting anything.  Pt sat 89-91 on RA.  Pt c/o seasonal allergies and states she coughs up flem a lot.  Pt has hx of AFIB and is currently AFIB.  142/64, 68, 100% 2 liters, 14 resp.

## 2015-02-25 NOTE — ED Notes (Signed)
Pt asked for urine, pt sipping water and states she will try soon.

## 2015-02-25 NOTE — ED Notes (Signed)
Transporting patient to new room assignment. 

## 2015-02-25 NOTE — ED Notes (Signed)
PA at bedside.

## 2015-02-25 NOTE — ED Notes (Signed)
Pt off unit with CT 

## 2015-02-25 NOTE — H&P (Signed)
History and Physical  [name] JXB:[id number] DOB: 1919/09/23 DOA: 02/25/2015  Referring physician: EDP PCP: Ezequiel Kayser, MD   Chief Complaint: n/v, syncope  HPI: [name] is a 81 y.o. female   With h/o chronic afib on coumadin, baseline still very functional, patient reported sudden onset of n/v, then passing out after getting out of bed, daughter reported patient has a Brief LOC, patient denies chest pain, no palpitation, on headache, no fever, no ab pain, no diarrhea, no lower extremity edema, does report chronic nasal congestion and increase cough recently. She was brought to the ER for further eval.  ER course: CT head no acute findings, CKg chronic afib, troponin negative. UA unremarkable, Basic labs with mild azotemia, otherwise unremarkable, hospitalist called to admit the patient.     Review of Systems:  Detail per HPI, Review of systems are otherwise negative  Past Medical History  Diagnosis Date  . Osteoporosis   . Atrial Fibrillation and Flutter     a. chronic coumadin;  b. 10/2004 Echo: EF 55-65%, no rwma, mild AI, mild-mod TR.  . Glaucoma   . Osteoporosis   . Hard of hearing     bilateral hearing aids  . Pleural effusion, right     a. 05/2014  . Permanent atrial fibrillation    Past Surgical History  Procedure Laterality Date  . Fixation kyphoplasty      back  . Cholecystectomy    . Glaucoma surgery  09/17/12   Social History:  reports that she has never smoked. She has never used smokeless tobacco. She reports that she drinks alcohol. Her drug history is not on file. Patient lives at home & is able to participate in activities of daily living independently   Allergies  Allergen Reactions  . Sulfa Antibiotics Anaphylaxis  . Risedronate Sodium Other (See Comments)    headaches    Family History  Problem Relation Age of Onset  . Cancer Mother   . Cancer Father       Prior to Admission medications   Medication Sig Start Date End  Date Taking? Authorizing Provider  calcium-vitamin D (OSCAL WITH D) 500-200 MG-UNIT per tablet Take 1 tablet by mouth daily.   Yes Historical Provider, MD  dorzolamide-timolol (COSOPT) 22.3-6.8 MG/ML ophthalmic solution Place 1 drop into both eyes 2 (two) times daily.    Yes Historical Provider, MD  fluticasone (FLONASE) 50 MCG/ACT nasal spray Place 2 sprays into the nose as needed for rhinitis.   Yes Historical Provider, MD  furosemide (LASIX) 20 MG tablet Take 1 tablet (20 mg total) by mouth daily. 06/02/14  Yes Ok Anis, NP  Vitamin D, Ergocalciferol, (DRISDOL) 50000 UNITS CAPS Take 50,000 Units by mouth every 14 (fourteen) days.    Yes Historical Provider, MD  warfarin (COUMADIN) 5 MG tablet TAKE ONE-HALF (1/2) TABLET EVERY DAY OR AS DIRECTED BY COUMADIN CLINIC Patient taking differently: 1.25 mg on Friday, 2.5 mg all other days every evening. 11/06/14  Yes Lyn Records, MD  zoledronic acid (RECLAST) 5 MG/100ML SOLN Inject 5 mg into the vein once. Once a year.   Yes Historical Provider, MD    Physical Exam: BP 128/56 mmHg  Pulse 71  Temp(Src) 98.1 F (36.7 C) (Oral)  Resp 18  SpO2 94%  General:  Frail ,hard of hearing, NAD. Eyes: PERRL ENT: unremarkable Neck: supple, no JVD Cardiovascular: IRRR Respiratory: +rhonchi, more prominent left lower lobe Abdomen: soft/ND/ND, positive bowel sounds Skin: no rash Musculoskeletal:  No  edema Psychiatric: calm/cooperative Neurologic: no focal findings            Labs on Admission:  Basic Metabolic Panel:  Recent Labs Lab 02/25/15 1445  NA 140  K 4.3  CL 101  CO2 31  GLUCOSE 132*  BUN 26*  CREATININE 0.75  CALCIUM 9.2   Liver Function Tests:  Recent Labs Lab 02/25/15 1445  AST 31  ALT 17  ALKPHOS 95  BILITOT 1.1  PROT 6.3*  ALBUMIN 3.5   No results for input(s): LIPASE, AMYLASE in the last 168 hours. No results for input(s): AMMONIA in the last 168 hours. CBC:  Recent Labs Lab 02/25/15 1445  WBC 9.6    NEUTROABS 8.5*  HGB 12.6  HCT 39.3  MCV 95.4  PLT 114*   Cardiac Enzymes:  Recent Labs Lab 02/25/15 1445  TROPONINI <0.03    BNP (last 3 results) No results for input(s): BNP in the last 8760 hours.  ProBNP (last 3 results) No results for input(s): PROBNP in the last 8760 hours.  CBG: No results for input(s): GLUCAP in the last 168 hours.  Radiological Exams on Admission: Ct Head Wo Contrast  02/25/2015   CLINICAL DATA:  Witnessed brief loss of consciousness, slid to ground, history atrial fibrillation  EXAM: CT HEAD WITHOUT CONTRAST  TECHNIQUE: Contiguous axial images were obtained from the base of the skull through the vertex without intravenous contrast.  COMPARISON:  None  FINDINGS: Generalized atrophy.  Normal ventricular morphology.  No midline shift or mass effect.  Small vessel chronic ischemic changes of deep cerebral white matter.  Otherwise normal appearance of brain parenchyma.  No intracranial hemorrhage, mass lesion, or acute infarction.  Visualized paranasal sinuses clear.  Bones unremarkable.  Atherosclerotic calcifications at the carotid siphons.  IMPRESSION: Atrophy with small vessel chronic ischemic changes of deep cerebral white matter.  No acute intracranial abnormalities.   Electronically Signed   By: Ulyses Southward M.D.   On: 02/25/2015 16:47    EKG: Independently reviewed. Chronic afib, no acute changes  Assessment/Plan Present on Admission:  . Syncope   Syncope: unclear etiology, initial work up in the ED unrevealing, clinically look dry, orthostatic vital sign pending, hold lasix.  Fall precaution.   Cough, no hypoxia, no chest pain, +rhonchi left lower lobe on exam, cxr two view pending.  Chronic afib: rate controlled without meds, continue chronic coumadin, INR therapeutic on admission. Pharmacy to dose coumadin.  N/V: sudden onset, denies ab pain, seems resolved since in the hospital. Continue monitor.    DVT prophylaxis: on  couamdin  Consultants: none  Code Status: full , discussed with daughter  Family Communication:  Patient  And daughter  Disposition Plan: admit to tele obs  Time spent:  Teigen Parslow MD, PhD [facility] Pager [phone] If 7PM-7AM, please contact night-coverage at [email or web address], password [facility]

## 2015-02-25 NOTE — ED Notes (Signed)
Pt ambulated to the bathroom with assistance. Pt tolerated well. 

## 2015-02-26 DIAGNOSIS — J189 Pneumonia, unspecified organism: Secondary | ICD-10-CM | POA: Diagnosis present

## 2015-02-26 DIAGNOSIS — H409 Unspecified glaucoma: Secondary | ICD-10-CM | POA: Diagnosis present

## 2015-02-26 DIAGNOSIS — M810 Age-related osteoporosis without current pathological fracture: Secondary | ICD-10-CM | POA: Diagnosis present

## 2015-02-26 DIAGNOSIS — R55 Syncope and collapse: Secondary | ICD-10-CM | POA: Diagnosis not present

## 2015-02-26 DIAGNOSIS — Z882 Allergy status to sulfonamides status: Secondary | ICD-10-CM | POA: Diagnosis not present

## 2015-02-26 DIAGNOSIS — T45515A Adverse effect of anticoagulants, initial encounter: Secondary | ICD-10-CM | POA: Diagnosis present

## 2015-02-26 DIAGNOSIS — I5032 Chronic diastolic (congestive) heart failure: Secondary | ICD-10-CM

## 2015-02-26 DIAGNOSIS — A419 Sepsis, unspecified organism: Secondary | ICD-10-CM | POA: Diagnosis not present

## 2015-02-26 DIAGNOSIS — J181 Lobar pneumonia, unspecified organism: Secondary | ICD-10-CM

## 2015-02-26 DIAGNOSIS — D6959 Other secondary thrombocytopenia: Secondary | ICD-10-CM | POA: Diagnosis present

## 2015-02-26 DIAGNOSIS — J690 Pneumonitis due to inhalation of food and vomit: Secondary | ICD-10-CM | POA: Diagnosis present

## 2015-02-26 DIAGNOSIS — R112 Nausea with vomiting, unspecified: Secondary | ICD-10-CM | POA: Diagnosis present

## 2015-02-26 DIAGNOSIS — I482 Chronic atrial fibrillation: Secondary | ICD-10-CM | POA: Diagnosis present

## 2015-02-26 DIAGNOSIS — Z7901 Long term (current) use of anticoagulants: Secondary | ICD-10-CM | POA: Diagnosis not present

## 2015-02-26 DIAGNOSIS — H9190 Unspecified hearing loss, unspecified ear: Secondary | ICD-10-CM | POA: Diagnosis present

## 2015-02-26 DIAGNOSIS — K2970 Gastritis, unspecified, without bleeding: Secondary | ICD-10-CM | POA: Diagnosis present

## 2015-02-26 LAB — BRAIN NATRIURETIC PEPTIDE: B NATRIURETIC PEPTIDE 5: 485.3 pg/mL — AB (ref 0.0–100.0)

## 2015-02-26 LAB — CBC
HEMATOCRIT: 35.4 % — AB (ref 36.0–46.0)
HEMOGLOBIN: 11.2 g/dL — AB (ref 12.0–15.0)
MCH: 30.5 pg (ref 26.0–34.0)
MCHC: 31.6 g/dL (ref 30.0–36.0)
MCV: 96.5 fL (ref 78.0–100.0)
Platelets: 109 10*3/uL — ABNORMAL LOW (ref 150–400)
RBC: 3.67 MIL/uL — ABNORMAL LOW (ref 3.87–5.11)
RDW: 14.3 % (ref 11.5–15.5)
WBC: 9.1 10*3/uL (ref 4.0–10.5)

## 2015-02-26 LAB — URINE CULTURE

## 2015-02-26 LAB — LACTIC ACID, PLASMA
LACTIC ACID, VENOUS: 0.8 mmol/L (ref 0.5–2.0)
Lactic Acid, Venous: 2.3 mmol/L (ref 0.5–2.0)
Lactic Acid, Venous: 2.1 mmol/L (ref 0.5–2.0)

## 2015-02-26 LAB — BASIC METABOLIC PANEL
Anion gap: 7 (ref 5–15)
BUN: 29 mg/dL — ABNORMAL HIGH (ref 6–20)
CALCIUM: 8.4 mg/dL — AB (ref 8.9–10.3)
CHLORIDE: 97 mmol/L — AB (ref 101–111)
CO2: 34 mmol/L — AB (ref 22–32)
Creatinine, Ser: 0.95 mg/dL (ref 0.44–1.00)
GFR calc non Af Amer: 50 mL/min — ABNORMAL LOW (ref >60)
GFR, EST AFRICAN AMERICAN: 58 mL/min — AB (ref >60)
Glucose, Bld: 113 mg/dL — ABNORMAL HIGH (ref 65–99)
POTASSIUM: 4.1 mmol/L (ref 3.5–5.1)
Sodium: 138 mmol/L (ref 135–145)

## 2015-02-26 LAB — PROTIME-INR
INR: 3.74 — ABNORMAL HIGH (ref 0.00–1.49)
Prothrombin Time: 36.1 seconds — ABNORMAL HIGH (ref 11.6–15.2)

## 2015-02-26 LAB — STREP PNEUMONIAE URINARY ANTIGEN: STREP PNEUMO URINARY ANTIGEN: NEGATIVE

## 2015-02-26 LAB — PROCALCITONIN: PROCALCITONIN: 0.89 ng/mL

## 2015-02-26 MED ORDER — SODIUM CHLORIDE 0.9 % IV SOLN
INTRAVENOUS | Status: DC
  Administered 2015-02-26 (×2): via INTRAVENOUS

## 2015-02-26 MED ORDER — DEXTROSE 5 % IV SOLN
500.0000 mg | INTRAVENOUS | Status: DC
  Administered 2015-02-26 – 2015-02-27 (×2): 500 mg via INTRAVENOUS
  Filled 2015-02-26 (×4): qty 500

## 2015-02-26 MED ORDER — CEFTRIAXONE SODIUM IN DEXTROSE 20 MG/ML IV SOLN
1.0000 g | INTRAVENOUS | Status: DC
  Administered 2015-02-26 – 2015-02-28 (×3): 1 g via INTRAVENOUS
  Filled 2015-02-26 (×3): qty 50

## 2015-02-26 MED ORDER — SODIUM CHLORIDE 0.9 % IV BOLUS (SEPSIS)
1000.0000 mL | Freq: Once | INTRAVENOUS | Status: AC
  Administered 2015-02-26: 1000 mL via INTRAVENOUS

## 2015-02-26 MED ORDER — SODIUM CHLORIDE 0.9 % IV BOLUS (SEPSIS)
250.0000 mL | Freq: Once | INTRAVENOUS | Status: AC
  Administered 2015-02-26: 250 mL via INTRAVENOUS

## 2015-02-26 NOTE — Consult Note (Signed)
   [facility] CM Inpatient Consult   02/26/2015  [name] 17-Jan-1920 [id number] Referral received for community follow up for HF, community support for transition of care back home.  Patient evaluated for community based chronic disease management services with [facility] as a benefit of patient's [employer]. Patient endorses that her primary MD is Dr. Hardie Shackleton, [facility].   Met with patient and her daughter [name] [facility]) at bedside to explain [facility] services. Patient's daughter lives in [place] and comes on a weekly basis to check on her mother.  She endorses that the patient is very hard of hearing but has been independent prior to admission.  She endorses that the patient had a fall prior to admission and is not sure if she passed out.  Explained [facility]'s role in post hospital follow up. Consent form signed.  She is scheduled to have follow up home care with [facility].   Patient will receive post discharge transition of care call and will be evaluated for monthly home visits for assessments and disease process education.  Left contact information and [facility] literature at bedside. Made Inpatient Case Manager aware that [facility] Management following. Of note, [facility] Care Management services does not replace or interfere with any services that are arranged by inpatient case management or social work.  For additional questions or referrals please contact: Natividad Brood, RN BSN [facility] Liaison  [phone] business mobile phone

## 2015-02-26 NOTE — Progress Notes (Signed)
Utilization Review completed. Raheim Beutler RN BSN CM 

## 2015-02-26 NOTE — Progress Notes (Signed)
ANTICOAGULATION CONSULT NOTE - Follow Up Consult  Pharmacy Consult for Warfarin Indication: atrial fibrillation  Allergies  Allergen Reactions  . Sulfa Antibiotics Anaphylaxis  . Risedronate Sodium Other (See Comments)    headaches    Patient Measurements: Height:  (149.9 cm) Weight: 82 lb 10.8 oz (37.5 kg) IBW/kg (Calculated) : 43.2  Vital Signs: Temp: 98.1 F (36.7 C) (07/22 1320) Temp Source: Oral (07/22 0531) BP: 94/67 mmHg (07/22 1320) Pulse Rate: 57 (07/22 1320)  Labs:  Recent Labs  02/25/15 1445 02/26/15 0451 02/26/15 0955  HGB 12.6 11.2*  --   HCT 39.3 35.4*  --   PLT 114* 109*  --   LABPROT 27.9*  --  36.1*  INR 2.66*  --  3.74*  CREATININE 0.75 0.95  --   TROPONINI <0.03  --   --     Estimated Creatinine Clearance: 21.9 mL/min (by C-G formula based on Cr of 0.95).   Medications:  Scheduled:  . antiseptic oral rinse  7 mL Mouth Rinse BID  . azithromycin  500 mg Intravenous Q24H  . calcium-vitamin D  1 tablet Oral Daily  . cefTRIAXone (ROCEPHIN)  IV  1 g Intravenous Q24H  . dorzolamide-timolol  1 drop Both Eyes BID  . warfarin  1.25 mg Oral Q Fri-1800  . warfarin  2.5 mg Oral Once per day on Sun Mon Tue Wed Thu Sat  . Warfarin - Pharmacist Dosing Inpatient   Does not apply q1800    Assessment: 81 yo F admitted 7/21 after several episodes of N/V and possible syncopal event.  Pt on Coumadin PTA for hx afib and was restarted on home regimen.  Overnight, INR has increased from 2.6 > 3.7 likely related to severe nausea PTA and/or infectious process.  No bleeding noted.  Goal of Therapy:  INR 2-3 Monitor platelets by anticoagulation protocol: Yes   Plan:  D/C standing orders for Coumadin. No Coumadin today. Continue daily INR.  [employer], Pharm.D., BCPS Clinical Pharmacist Pager [phone] 02/26/2015 1:33 PM

## 2015-02-26 NOTE — Progress Notes (Addendum)
Rechecking pt at 1820.  Pt doing well.  Just finished liquid diet.  No N/V.  Denies CP or sob but has DOE with minimal exertion.  Personally checked vitals --98.7-HR 74-RR 20--115/58  CV-RRR Lung--basilar crackles, diminished R-base  Saline lock IVF.  BNP 485. Can give additional boluses if needed if becomes hypotensive again.  Am cortisol  DTat

## 2015-02-26 NOTE — Progress Notes (Signed)
Dr. Arbutus Leas made aware of lactic acid level of 2.3, BNP 485.3. Current BP after ambulating with Physical Therapy 111/52. Patient denies any shortness of breath or distress.

## 2015-02-26 NOTE — Evaluation (Signed)
Physical Therapy Evaluation Patient Details Name: [name] MRN: [id number] DOB: 1920/08/06 Today's Date: 02/26/2015   History of Present Illness  Pt adm with PNA, syncope, and hypotension. PMH - afib, kyphoplasty  Clinical Impression  Pt admitted with above diagnosis and presents to PT with functional limitations due to deficits listed below (See PT problem list). Pt needs skilled PT to maximize independence and safety to allow discharge to home.      Follow Up Recommendations Home health PT    Equipment Recommendations  None recommended by PT    Recommendations for Other Services       Precautions / Restrictions Precautions Precautions: Fall      Mobility  Bed Mobility Overal bed mobility: Modified Independent                Transfers Overall transfer level: Needs assistance Equipment used: None Transfers: Sit to/from Stand Sit to Stand: Min guard         General transfer comment: assist for balance  Ambulation/Gait Ambulation/Gait assistance: Min guard Ambulation Distance (Feet): 200 Feet Assistive device: None;4-wheeled walker Gait Pattern/deviations: Step-through pattern;Decreased stride length;Antalgic;Trunk flexed     General Gait Details: Slightly unsteady which was improved with use of rollator  Stairs            Wheelchair Mobility    Modified Rankin (Stroke Patients Only)       Balance Overall balance assessment: Needs assistance Sitting-balance support: No upper extremity supported;Feet supported Sitting balance-Leahy Scale: Good     Standing balance support: No upper extremity supported;During functional activity Standing balance-Leahy Scale: Fair                               Pertinent Vitals/Pain Pain Assessment: No/denies pain    Home Living Family/patient expects to be discharged to:: Private residence Living Arrangements: Alone Available Help at Discharge: Family (daughter here  temporarily) Type of Home: House Home Access: Stairs to enter Entrance Stairs-Rails: Right;Left;Can reach both Secretary/administrator of Steps: 4-5 Home Layout: One level Home Equipment: Walker - standard;Cane - single point Lexicographer)      Prior Function Level of Independence: Independent               Hand Dominance        Extremity/Trunk Assessment   Upper Extremity Assessment: Generalized weakness           Lower Extremity Assessment: Generalized weakness         Communication   Communication: HOH  Cognition Arousal/Alertness: Awake/alert Behavior During Therapy: WFL for tasks assessed/performed Overall Cognitive Status: Within Functional Limits for tasks assessed                      General Comments      Exercises        Assessment/Plan    PT Assessment Patient needs continued PT services  PT Diagnosis Difficulty walking;Generalized weakness   PT Problem List Decreased strength;Decreased activity tolerance;Decreased balance;Decreased mobility  PT Treatment Interventions DME instruction;Gait training;Functional mobility training;Therapeutic activities;Therapeutic exercise;Balance training;Patient/family education   PT Goals (Current goals can be found in the Care Plan section) Acute Rehab PT Goals Patient Stated Goal: return home PT Goal Formulation: With patient Time For Goal Achievement: 03/05/15 Potential to Achieve Goals: Good    Frequency Min 3X/week   Barriers to discharge        Co-evaluation  End of Session Equipment Utilized During Treatment: Gait belt Activity Tolerance: Patient tolerated treatment well Patient left: in chair;with call bell/phone within reach;with chair alarm set Nurse Communication: Mobility status         Time: 4098-1191 PT Time Calculation (min) (ACUTE ONLY): 40 min   Charges:   PT Evaluation $Initial PT Evaluation Tier I: 1 Procedure PT Treatments $Gait Training:  23-37 mins   PT G Codes:        [name] 03/01/15, 1:42 PM [facility] PT [phone]

## 2015-02-26 NOTE — Progress Notes (Cosign Needed)
RN reported patient developed BP drop to 90/40. Chest xRay revealed PNA. Fluids, Lacticacid and antibiotic therapy were ordered  Raymon Mutton, PA-C

## 2015-02-26 NOTE — Progress Notes (Signed)
PROGRESS NOTE  [name] ZOX:[id number] DOB: 12/26/1919 DOA: 02/25/2015 PCP: Ezequiel Kayser, MD  Brief history 81 year old female with a history of permanent atrial fibrillation, diastolic CHF presents with multiple episodes of nausea and vomiting followed by a questionable syncopal episode. On the day of admission, the patient had numerous episodes of emesis without blood. The patient went back to her bedroom to rest. Her daughter apparently, heard some kind of noise and came to check on her mom at which time she found the patient sitting on the floor awake and conversant.  The patient denied any loss of consciousness, and was unclear whether the patient actually passed out. Subsequent, the patient had a brief episode where she lost tone in her neck, but upon calling her name, the patient responded appropriately. Prior to the events on the day of admission, the patient had been in her usual state of health without any worsening shortness of breath, chest discomfort, fevers, chills, diarrhea, abdominal pain, dysuria, hematuria. Evaluation in the emergency department revealed right lower lobe and right middle lobe opacity with low-grade temperature up to 99.2F. Assessment/Plan: Pneumonia -Likely community-acquired pneumonia although there is some concern of possible aspiration given the patient's numerous episodes of vomiting -Continue azithromycin and ceftriaxone for now and monitor clinically -urine legionella antigen, urine strept pneumo antigen Hypotension -7/22--SBP was in 70s -give fluid bolus and start IVF -check lactic acid, procalcitonin Transient loss of consciousness -unclear if pt truly had syncopal episode -likely vasovagal/volume depletion due to pt's numerous episodes of vomiting -TTE N/V -LFTs normal -UA without pyuria -no abdominal pain -?gastritis -improving -advance diet as tolerated -wants to eat today Permanent atrial fibrillation -Rate  controlled -Continue warfarin Chronic diastolic CHF -Clinically compensated -Monitor on fluids -The patient clinically appears volume depleted -Hold furosemide Thrombocytopenia -Likely due to the patient's chronic warfarin use -Drop in platelets likely due to infectious process -Monitor for signs of bleeding   Family Communication:   Daughter updated at beside Disposition Plan:   Home when medically stable       Procedures/Studies: Dg Chest 2 View  02/25/2015   CLINICAL DATA:  Sudden onset nausea and vomiting.  Syncope.  EXAM: CHEST  2 VIEW  COMPARISON:  06/08/2014.  FINDINGS: Interval patchy opacity in the right middle lobe and right lower lobe, medially. Small bilateral pleural effusions, improved on the right. Stable enlarged cardiac silhouette, hyperexpanded lungs and mildly prominent interstitial markings. Stable thoracic spine compression deformities of acute kyphosis. Diffuse osteopenia. Cholecystectomy clips.  IMPRESSION: 1. Right lower lobe and right middle lobe pneumonia. No underlying mass cannot be excluded. 2. Small right pleural effusion. 3. Stable cardiomegaly and COPD. 4. Stable multiple thoracic vertebral compression deformities and acute kyphosis.   Electronically Signed   By: Beckie Salts M.D.   On: 02/25/2015 19:10   Ct Head Wo Contrast  02/25/2015   CLINICAL DATA:  Witnessed brief loss of consciousness, slid to ground, history atrial fibrillation  EXAM: CT HEAD WITHOUT CONTRAST  TECHNIQUE: Contiguous axial images were obtained from the base of the skull through the vertex without intravenous contrast.  COMPARISON:  None  FINDINGS: Generalized atrophy.  Normal ventricular morphology.  No midline shift or mass effect.  Small vessel chronic ischemic changes of deep cerebral white matter.  Otherwise normal appearance of brain parenchyma.  No intracranial hemorrhage, mass lesion, or acute infarction.  Visualized paranasal sinuses clear.  Bones unremarkable.  Atherosclerotic  calcifications at the carotid siphons.  IMPRESSION: Atrophy with small vessel chronic ischemic changes of deep cerebral white matter.  No acute intracranial abnormalities.   Electronically Signed   By: Ulyses Southward M.D.   On: 02/25/2015 16:47        Subjective: Patient states that she is somewhat hungry and wants to eat. Denies any fevers, chills, chest pain, shortness breath, abdominal pain, dysuria, hematuria. No emesis since admission. Nausea has improved. Denies any headaches.   Objective: Filed Vitals:   02/26/15 0730 02/26/15 0810 02/26/15 0812 02/26/15 0822  BP: 78/55  Pulse: 95 57    Temp:      TempSrc:      Resp: 18 16    Height:      Weight:      SpO2: 97% 97%      Intake/Output Summary (Last 24 hours) at 02/26/15 0937 Last data filed at 02/26/15 0929  Gross per 24 hour  Intake    606 ml  Output    375 ml  Net    231 ml   Weight change:  Exam:   General:  Pt is alert, follows commands appropriately, not in acute distress  HEENT: No icterus, No thrush, No neck mass, [place]/AT  Cardiovascular: RRR, S1/S2, no rubs, no gallops  Respiratory: Bibasilar crackles, right greater than left. No wheeze.   Abdomen: Soft/+BS, non tender, non distended, no guarding; no hepatosplenomegaly   Extremities: No edema, No lymphangitis, No petechiae, No rashes, no synovitis; no cyanosis or clubbing  Data Reviewed: Basic Metabolic Panel:  Recent Labs Lab 02/25/15 1445 02/26/15 0451  NA 140 138  K 4.3 4.1  CL 101 97*  CO2 31 34*  GLUCOSE 132* 113*  BUN 26* 29*  CREATININE 0.75 0.95  CALCIUM 9.2 8.4*   Liver Function Tests:  Recent Labs Lab 02/25/15 1445  AST 31  ALT 17  ALKPHOS 95  BILITOT 1.1  PROT 6.3*  ALBUMIN 3.5   No results for input(s): LIPASE, AMYLASE in the last 168 hours. No results for input(s): AMMONIA in the last 168 hours. CBC:  Recent Labs Lab 02/25/15 1445 02/26/15 0451  WBC 9.6 9.1  NEUTROABS 8.5*  --   HGB 12.6 11.2*    HCT 39.3 35.4*  MCV 95.4 96.5  PLT 114* 109*   Cardiac Enzymes:  Recent Labs Lab 02/25/15 1445  TROPONINI <0.03   BNP: Invalid input(s): POCBNP CBG: No results for input(s): GLUCAP in the last 168 hours.  No results found for this or any previous visit (from the past 240 hour(s)).   Scheduled Meds: . antiseptic oral rinse  7 mL Mouth Rinse BID  . azithromycin  500 mg Intravenous Q24H  . calcium-vitamin D  1 tablet Oral Daily  . cefTRIAXone (ROCEPHIN)  IV  1 g Intravenous Q24H  . dorzolamide-timolol  1 drop Both Eyes BID  . sodium chloride  1,000 mL Intravenous Once  . warfarin  1.25 mg Oral Q Fri-1800  . warfarin  2.5 mg Oral Once per day on Sun Mon Tue Wed Thu Sat  . Warfarin - Pharmacist Dosing Inpatient   Does not apply q1800   Continuous Infusions: . sodium chloride 75 mL/hr at 02/26/15 0619     Chelci Winterm[place], DO  [facility] Pager [phone]  If 7PM-7AM, please contact night-coverage [email or web address] Password [facility] 02/26/2015, 9:37 AM

## 2015-02-26 NOTE — Care Management Note (Addendum)
Case Management Note  Patient Details  Name: [name] MRN: [id number] Date of Birth: Dec 27, 1919  Subjective/Objective:                 Patient from home alone. Although 89, patient able to complete ADL's and independent per bedside RN. Patient admitted with syncope and LOC.   Action/Plan:  Anticipate HH PT and return to home. Will place [facility] consult based on patient's age although she does not meet >3 admissions in past 6 months. HH set up with [facility] for PT and RN.  Expected Discharge Date:                  Expected Discharge Plan:  Home w Home Health Services  In-House Referral:     Discharge planning Services  CM Consult  Post Acute Care Choice:    Choice offered to:     DME Arranged:    DME Agency:     HH Arranged:    HH Agency:     Status of Service:  In process, will continue to follow  Medicare Important Message Given:    Date Medicare IM Given:    Medicare IM give by:    Date Additional Medicare IM Given:    Additional Medicare Important Message give by:     If discussed at Long Length of Stay Meetings, dates discussed:    Additional Comments:  Lawerance Sabal, RN 02/26/2015, 11:46 AM

## 2015-02-26 NOTE — Progress Notes (Addendum)
BP 82/40 HR 95. Patient Alert and oriented. Asymptomatic while lying supine in bed. Patient has already received one 250cc bolus of NS. Patient has history of CHF. 250cc of Azithromycin currently infusing. Dr. Arbutus Leas made aware. Placed on 2L [place] for comfort.   1610: BP 70/38 HR 57. Dr. Arbutus Leas paged. New IV placed in Left Hand. Dr. Arbutus Leas to come to bedside  [phone]: verbal order for 1000cc bolus from Dr. Arbutus Leas  10:07 AM Bolus completed and BP 92/52 HR 49. Patient denies any shortness of breath. Patient states she is feeling well.

## 2015-02-27 ENCOUNTER — Inpatient Hospital Stay (HOSPITAL_COMMUNITY): Payer: Medicare Other

## 2015-02-27 LAB — PROTIME-INR
INR: 3.6 — ABNORMAL HIGH (ref 0.00–1.49)
PROTHROMBIN TIME: 35.1 s — AB (ref 11.6–15.2)

## 2015-02-27 LAB — BASIC METABOLIC PANEL
ANION GAP: 7 (ref 5–15)
BUN: 23 mg/dL — ABNORMAL HIGH (ref 6–20)
CALCIUM: 8.1 mg/dL — AB (ref 8.9–10.3)
CO2: 26 mmol/L (ref 22–32)
CREATININE: 0.69 mg/dL (ref 0.44–1.00)
Chloride: 102 mmol/L (ref 101–111)
GFR calc Af Amer: >60 mL/min (ref >60)
GFR calc non Af Amer: >60 mL/min (ref >60)
Glucose, Bld: 106 mg/dL — ABNORMAL HIGH (ref 65–99)
Potassium: 4.2 mmol/L (ref 3.5–5.1)
Sodium: 135 mmol/L (ref 135–145)

## 2015-02-27 LAB — CBC
HCT: 34.5 % — ABNORMAL LOW (ref 36.0–46.0)
Hemoglobin: 11 g/dL — ABNORMAL LOW (ref 12.0–15.0)
MCH: 30.7 pg (ref 26.0–34.0)
MCHC: 31.9 g/dL (ref 30.0–36.0)
MCV: 96.4 fL (ref 78.0–100.0)
PLATELETS: 104 10*3/uL — AB (ref 150–400)
RBC: 3.58 MIL/uL — ABNORMAL LOW (ref 3.87–5.11)
RDW: 14.4 % (ref 11.5–15.5)
WBC: 8.6 10*3/uL (ref 4.0–10.5)

## 2015-02-27 LAB — CORTISOL-AM, BLOOD: Cortisol - AM: 14.4 ug/dL (ref 6.7–22.6)

## 2015-02-27 LAB — LACTIC ACID, PLASMA: Lactic Acid, Venous: 0.9 mmol/L (ref 0.5–2.0)

## 2015-02-27 MED ORDER — ACETAMINOPHEN 325 MG PO TABS: 650.0000 mg | ORAL_TABLET | Freq: Four times a day (QID) | ORAL | Status: DC | PRN

## 2015-02-27 MED ORDER — ONDANSETRON HCL 4 MG/2ML IJ SOLN: 4.0000 mg | Freq: Four times a day (QID) | INTRAMUSCULAR | Status: DC | PRN

## 2015-02-27 MED ORDER — AZITHROMYCIN 500 MG PO TABS
500.0000 mg | ORAL_TABLET | Freq: Every day | ORAL | Status: DC
  Administered 2015-02-28: 500 mg via ORAL
  Filled 2015-02-27 (×2): qty 1

## 2015-02-27 NOTE — Progress Notes (Signed)
Physical Therapy Treatment Patient Details Name: [name] MRN: [id number] DOB: 06/25/1920 Today's Date: 02/27/2015    History of Present Illness Pt adm with PNA, syncope, and hypotension. PMH - afib, kyphoplasty    PT Comments    Pt is progressing with her mobility, but still reaching for stability during gait. I attempted cane, but she is just as unstable with the cane as she is without the cane (likely due to not knowing how to use it).  I recommended to daughter a RW for gait when she first comes home as it is easier to use with less training.  PT will continue to follow acutely.    Follow Up Recommendations  Home health PT;Supervision for mobility/OOB     Equipment Recommendations  Rolling walker with 5" wheels    Recommendations for Other Services   NA     Precautions / Restrictions Precautions Precautions: Fall Precaution Comments: mildly unsteady on her feet.  Restrictions Weight Bearing Restrictions: No    Mobility  Bed Mobility Overal bed mobility: Modified Independent             General bed mobility comments: HOB elevated, used railing for leverage  Transfers Overall transfer level: Needs assistance Equipment used: Straight cane Transfers: Sit to/from Stand Sit to Stand: Min guard         General transfer comment: min guard assist for balance and stability during transitions.   Ambulation/Gait Ambulation/Gait assistance: Min assist Ambulation Distance (Feet): 85 Feet Assistive device: None;Straight cane Gait Pattern/deviations: Step-through pattern;Staggering left;Staggering right Gait velocity: decreased   General Gait Details: Staggering gait pattern both with and without cane.  Reaching for external support of furniture, therapist and hallway railing.  O2 sats dropped to 87% during gait and DOE 3/4.  Pt was able to stand and take a standing rest break to get back up into the low 90s.  She also is having difficulty walking and talking due  to DOE.        Balance Overall balance assessment: Needs assistance Sitting-balance support: Feet supported;No upper extremity supported Sitting balance-Leahy Scale: Good     Standing balance support: Single extremity supported;No upper extremity supported Standing balance-Leahy Scale: Fair                      Cognition Arousal/Alertness: Awake/alert (very HOH) Behavior During Therapy: WFL for tasks assessed/performed Overall Cognitive Status: Within Functional Limits for tasks assessed                         General Comments General comments (skin integrity, edema, etc.): I spoke with pt's daughter who reports that she has a walker without wheels.  I advised that pt may benefit from RW for a short period of time while she recovers.        Pertinent Vitals/Pain Pain Assessment: No/denies pain, O2 sats 87% with gait on RA. DOE 3/4, standing rest break O2 sats >90% after less than one minute.             PT Goals (current goals can now be found in the care plan section) Acute Rehab PT Goals Patient Stated Goal: return home Progress towards PT goals: Progressing toward goals    Frequency  Min 3X/week    PT Plan Current plan remains appropriate       End of Session Equipment Utilized During Treatment: Gait belt Activity Tolerance: Patient limited by fatigue Patient left: in chair;with call bell/phone  within reach;with chair alarm set;with family/visitor present     Time: 0823-0855 PT Time Calculation (min) (ACUTE ONLY): 32 min  Charges:  $Gait Training: 8-22 mins $Self Care/Home Management: 8-22                      [name] B. Malosi Hemstreet, PT, DPT [phone]   02/27/2015, 9:02 AM

## 2015-02-27 NOTE — Discharge Summary (Signed)
Physician Discharge Summary  [name]:[id number] DOB: 11/08/1919 DOA: 02/25/2015  PCP: Ezequiel Kayser, MD  Admit date: 02/25/2015 Discharge date: 02/28/15 Recommendations for Outpatient Follow-up:  1. Pt will need to follow up with PCP in 1- 2 weeks post discharge 2. Please obtain BMP 1-2 weeks 3. Please check INR 7/25 or 7/26 due to interaction of warfarin with azithromycin and adjust warfarin accordingly  Discharge Diagnoses:  Pneumonia/Sepsis -Likely community-acquired pneumonia although there is some concern of possible aspiration given the patient's numerous episodes of vomiting -Continue azithromycin and ceftriaxone for now and monitor clinically -urine legionella antigen, urine strept pneumo antigen -lactic acid 2.3-->0.9 -Patient will go home with Cefdinir x 4 more days to finish 7 days and azithromycin x 2 more days to finish 5 days of tx Hypotension -7/22--SBP was in 70s -give fluid bolus and start IVF-->BP improved-->saline lock fluids  -PCT--0.89-->0.31 Transient loss of consciousness -unclear if pt truly had syncopal episode -likely vasovagal/volume depletion due to pt's numerous episodes of vomiting/coughing --doubt structural heart disease, cancel echo N/V -LFTs normal -UA without pyuria -no abdominal pain -?gastritis -improved with tx of infection -advance diet-->tolerated Permanent atrial fibrillation -Rate controlled -INR slightly supratherapeutic during hospitalization due to interaction with azithromycin -resume warfarin on 7/24 home dose -INR 2.10 on day of d/c Chronic diastolic CHF -Clinically compensated -Monitor on fluids-->saline locked -Hold furosemide during her hospitalization -Resume her furosemide on 03/01/2015--this was communicated with patient and daughter  -02/27/15--Repeat chest x-ray today--oxygen saturation 93% on room air Thrombocytopenia -Likely due to the patient's chronic warfarin use -Drop in platelets likely due to  infectious process -Monitor for signs of bleeding--none  Discharge Condition: stable  Disposition:  Follow-up Information    Follow up with [facility].   Why:  PT and RN, will call 24 to 48 hours after discharge   Contact information:   [address] [place] [address] [phone]       Diet:heart healthy Wt Readings from Last 3 Encounters:  02/28/15 40.9 kg (90 lb 2.7 oz)  02/16/15 38.284 kg (84 lb 6.4 oz)  11/24/14 38.216 kg (84 lb 4 oz)    History of present illness:  81 year old female with a history of permanent atrial fibrillation, diastolic CHF presents with multiple episodes of nausea and vomiting followed by a questionable syncopal episode. On the day of admission, the patient had numerous episodes of emesis without blood. The patient went back to her bedroom to rest. Her daughter apparently, heard some kind of noise and came to check on her mom at which time she found the patient sitting on the floor awake and conversant. The patient denied any loss of consciousness, and was unclear whether the patient actually passed out. Subsequent, the patient had a brief episode where she lost tone in her neck, but upon calling her name, the patient responded appropriately. Prior to the events on the day of admission, the patient had been in her usual state of health without any worsening shortness of breath, chest discomfort, fevers, chills, diarrhea, abdominal pain, dysuria, hematuria. Evaluation in the emergency department revealed right lower lobe and right middle lobe opacity with low-grade temperature up to 99.66F.  Shortly after admission, the patient became hypotensive with lactic acid elevation. The patient was hydrated aggressively and she experienced clinical improvement. Her blood pressure improved and she clinically improved. Fluids were saline locked and the patient continued to do well clinically. There were no signs of fluid overload.  Discharge  Exam: Filed Vitals:  02/28/15 0611  BP: 132/63  Pulse: 71  Temp: 98.8 F (37.1 C)  Resp: 18   Filed Vitals:   02/27/15 2144 02/28/15 0120 02/28/15 0611 02/28/15 0700  BP: 124/60  132/63   Pulse: 71  71   Temp: 98.8 F (37.1 C)  98.8 F (37.1 C)   TempSrc: Oral  Oral   Resp: 18  18   Height:      Weight:    40.9 kg (90 lb 2.7 oz)  SpO2: 92% 96% 96%    General: A&O x 3, NAD, pleasant, cooperative Cardiovascular: RRR, no rub, no gallop, no S3 Respiratory: Bibasilar crackles, right greater than left. No wheeze Abdomen:soft, nontender, nondistended, positive bowel sounds Extremities: No edema, No lymphangitis, no petechiae  Discharge Instructions      Discharge Instructions    AMB Referral to [facility] Care Management    Complete by:  As directed   Reason for consult:  Community follow up - patient lives alone; HF and atrial fib; pt is hard of hearing  Diagnoses of:  Heart Failure  Expected date of contact:  1-3 days (reserved for hospital discharges)  Please assign to community nurse for transition of care calls and assess for home visits. Will have [facility].  Patient with a recent fall but has been independent.  Patient lives alone, very hard of hearing (new hearing aides ordered per daughter)  Please contact daughter for appointments at [phone].  Daughter lives in [place], [place].  Questions please call: Charlesetta Shanks, RN BSN CCM [facility]  [phone] business mobile phone     Diet - low sodium heart healthy    Complete by:  As directed      Discharge instructions    Complete by:  As directed   Do NOT take furosemide today Restart furosemide 20mg  daily on 03/01/15     Increase activity slowly    Complete by:  As directed             Medication List    TAKE these medications        azithromycin 500 MG tablet  Commonly known as:  ZITHROMAX  Take 1 tablet (500 mg total) by mouth daily. Start 03/01/15  Start taking on:   03/01/2015     calcium-vitamin D 500-200 MG-UNIT per tablet  Commonly known as:  OSCAL WITH D  Take 1 tablet by mouth daily.     cefdinir 300 MG capsule  Commonly known as:  OMNICEF  Take 1 capsule (300 mg total) by mouth 2 (two) times daily. Start 03/01/15  Start taking on:  03/01/2015     dorzolamide-timolol 22.3-6.8 MG/ML ophthalmic solution  Commonly known as:  COSOPT  Place 1 drop into both eyes 2 (two) times daily.     fluticasone 50 MCG/ACT nasal spray  Commonly known as:  FLONASE  Place 2 sprays into the nose as needed for rhinitis.     furosemide 20 MG tablet  Commonly known as:  LASIX  Take 1 tablet (20 mg total) by mouth daily.     RECLAST 5 MG/100ML Soln injection  Generic drug:  zoledronic acid  Inject 5 mg into the vein once. Once a year.     Vitamin D (Ergocalciferol) 50000 UNITS Caps capsule  Commonly known as:  DRISDOL  Take 50,000 Units by mouth every 14 (fourteen) days.     warfarin 5 MG tablet  Commonly known as:  COUMADIN  TAKE ONE-HALF (1/2) TABLET EVERY DAY  OR AS DIRECTED BY COUMADIN CLINIC         The results of significant diagnostics from this hospitalization (including imaging, microbiology, ancillary and laboratory) are listed below for reference.    Significant Diagnostic Studies: Dg Chest 2 View  02/27/2015   CLINICAL DATA:  Shortness of breath and recent syncopal episode  EXAM: CHEST - 2 VIEW  COMPARISON:  02/25/2015  FINDINGS: Cardiac shadow is mildly enlarged but stable. The lungs are well expanded bilaterally. Persistent right basilar infiltrate is seen but significantly improved when compared with the prior exam. Aortic atherosclerotic changes are noted. No bony abnormality is noted.  IMPRESSION: Improving infiltrate in the right lung base when compared with the prior exam.   Electronically Signed   By: Alcide Clever M.D.   On: 02/27/2015 13:44   Dg Chest 2 View  02/25/2015   CLINICAL DATA:  Sudden onset nausea and vomiting.  Syncope.   EXAM: CHEST  2 VIEW  COMPARISON:  06/08/2014.  FINDINGS: Interval patchy opacity in the right middle lobe and right lower lobe, medially. Small bilateral pleural effusions, improved on the right. Stable enlarged cardiac silhouette, hyperexpanded lungs and mildly prominent interstitial markings. Stable thoracic spine compression deformities of acute kyphosis. Diffuse osteopenia. Cholecystectomy clips.  IMPRESSION: 1. Right lower lobe and right middle lobe pneumonia. No underlying mass cannot be excluded. 2. Small right pleural effusion. 3. Stable cardiomegaly and COPD. 4. Stable multiple thoracic vertebral compression deformities and acute kyphosis.   Electronically Signed   By: Beckie Salts M.D.   On: 02/25/2015 19:10   Ct Head Wo Contrast  02/25/2015   CLINICAL DATA:  Witnessed brief loss of consciousness, slid to ground, history atrial fibrillation  EXAM: CT HEAD WITHOUT CONTRAST  TECHNIQUE: Contiguous axial images were obtained from the base of the skull through the vertex without intravenous contrast.  COMPARISON:  None  FINDINGS: Generalized atrophy.  Normal ventricular morphology.  No midline shift or mass effect.  Small vessel chronic ischemic changes of deep cerebral white matter.  Otherwise normal appearance of brain parenchyma.  No intracranial hemorrhage, mass lesion, or acute infarction.  Visualized paranasal sinuses clear.  Bones unremarkable.  Atherosclerotic calcifications at the carotid siphons.  IMPRESSION: Atrophy with small vessel chronic ischemic changes of deep cerebral white matter.  No acute intracranial abnormalities.   Electronically Signed   By: Ulyses Southward M.D.   On: 02/25/2015 16:47     Microbiology: Recent Results (from the past 240 hour(s))  Urine culture     Status: None   Collection Time: 02/25/15  3:57 PM  Result Value Ref Range Status   Specimen Description URINE, RANDOM  Final   Special Requests NONE  Final   Culture MULTIPLE SPECIES PRESENT, SUGGEST RECOLLECTION   Final   Report Status 02/26/2015 FINAL  Final     Labs: Basic Metabolic Panel:  Recent Labs Lab 02/25/15 1445 02/26/15 0451 02/27/15 0430 02/28/15 0445  NA 140 138 135 132*  K 4.3 4.1 4.2 3.7  CL 101 97* 102 100*  CO2 31 34* 26 25  GLUCOSE 132* 113* 106* 105*  BUN 26* 29* 23* 18  CREATININE 0.75 0.95 0.69 0.56  CALCIUM 9.2 8.4* 8.1* 8.2*   Liver Function Tests:  Recent Labs Lab 02/25/15 1445  AST 31  ALT 17  ALKPHOS 95  BILITOT 1.1  PROT 6.3*  ALBUMIN 3.5   No results for input(s): LIPASE, AMYLASE in the last 168 hours. No results for input(s): AMMONIA in the last  168 hours. CBC:  Recent Labs Lab 02/25/15 1445 02/26/15 0451 02/27/15 0430  WBC 9.6 9.1 8.6  NEUTROABS 8.5*  --   --   HGB 12.6 11.2* 11.0*  HCT 39.3 35.4* 34.5*  MCV 95.4 96.5 96.4  PLT 114* 109* 104*   Cardiac Enzymes:  Recent Labs Lab 02/25/15 1445  TROPONINI <0.03   BNP: Invalid input(s): POCBNP CBG: No results for input(s): GLUCAP in the last 168 hours.  Time coordinating discharge:  Greater than 30 minutes  Signed:  Zachary Nole, DO [facility] Pager: [phone] 02/28/2015, 9:46 AM

## 2015-02-27 NOTE — Progress Notes (Signed)
Physical Therapy Treatment Patient Details Name: [name] MRN: [id number] DOB: [birth year] Today's Date: 02/27/2015    History of Present Illness Pt adm with PNA, syncope, and hypotension. PMH - afib, kyphoplasty    PT Comments    Pt is progressing well with gait stability this PM, however, she is still desating with gait on RA, however, rebounds quickly with sitting or standing rest breaks.  LE and UE exercises completed as well.  Resting O2 sats are higher this PM than in the AM as well.  95% on RA when I left her after walking.  I encouraged her daughter to walk with her again before the daughter leaves for the day. PT will continue to follow acutely.    Follow Up Recommendations  Home health PT;Supervision for mobility/OOB     Equipment Recommendations  None recommended by PT (family checked and they do have a RW)    Recommendations for Other Services   NA     Precautions / Restrictions Precautions Precautions: Fall Precaution Comments: Pt is mildly unsteady on her feet    Mobility           Transfers Overall transfer level: Needs assistance Equipment used: 1 person hand held assist Transfers: Sit to/from Stand Sit to Stand: Min guard         General transfer comment: Min guard assist for transitions for safety  Ambulation/Gait Ambulation/Gait assistance: Supervision Ambulation Distance (Feet): 200 Feet Assistive device: Rolling walker (2 wheeled) Gait Pattern/deviations: Step-through pattern;Shuffle;Trunk flexed Gait velocity: decreased   General Gait Details: Pt much more steady with RW, tennis shoes for PM gait.  Pt's gait speed and endurance significantly improved with RW use compared to cane and no assistive device earlier today.           Balance Overall balance assessment: Needs assistance Sitting-balance support: Feet supported Sitting balance-Leahy Scale: Good     Standing balance support: Bilateral upper extremity  supported Standing balance-Leahy Scale: Fair                      Cognition Arousal/Alertness: Awake/alert (very HOH) Behavior During Therapy: WFL for tasks assessed/performed Overall Cognitive Status: Within Functional Limits for tasks assessed                      Exercises General Exercises - Upper Extremity Shoulder Flexion: AROM;Both;10 reps;Seated Shoulder ABduction: AROM;Both;10 reps;Standing General Exercises - Lower Extremity Long Arc Quad: AROM;Both;10 reps;Seated Hip Flexion/Marching: AROM;Both;10 reps;Seated Toe Raises: AROM;Both;10 reps;Seated Heel Raises: AROM;Both;10 reps;Seated        Pertinent Vitals/Pain Pain Assessment: No/denies pain           PT Goals (current goals can now be found in the care plan section) Acute Rehab PT Goals Patient Stated Goal: return home Progress towards PT goals: Progressing toward goals    Frequency  Min 3X/week    PT Plan Current plan remains appropriate       End of Session   Activity Tolerance: Patient limited by fatigue;Other (comment) (limited by DOE) Patient left: in chair;with call bell/phone within reach;with family/visitor present     Time: 4098-1191 PT Time Calculation (min) (ACUTE ONLY): 32 min  Charges:  $Gait Training: 8-22 mins $Therapeutic Exercise: 8-22 mins                      Charlette Hennings B. Tujuana Kilmartin, PT, DPT [phone]   02/27/2015, 3:38 PM

## 2015-02-27 NOTE — Progress Notes (Signed)
ANTICOAGULATION CONSULT NOTE - Follow Up Consult  Pharmacy Consult for Warfarin Indication: atrial fibrillation  Allergies  Allergen Reactions  . Sulfa Antibiotics Anaphylaxis  . Risedronate Sodium Other (See Comments)    headaches    Patient Measurements: Height:  (149.9 cm) Weight: 82 lb 10.8 oz (37.5 kg) IBW/kg (Calculated) : 43.2  Vital Signs: Temp: 98.8 F (37.1 C) (07/23 0559) Temp Source: Oral (07/23 0559) BP: 134/65 mmHg (07/23 0559) Pulse Rate: 64 (07/23 0828)  Labs:  Recent Labs  02/25/15 1445 02/26/15 0451 02/26/15 0955 02/27/15 0430  HGB 12.6 11.2*  --  11.0*  HCT 39.3 35.4*  --  34.5*  PLT 114* 109*  --  104*  LABPROT 27.9*  --  36.1* 35.1*  INR 2.66*  --  3.74* 3.60*  CREATININE 0.75 0.95  --  0.69  TROPONINI <0.03  --   --   --     Estimated Creatinine Clearance: 26 mL/min (by C-G formula based on Cr of 0.69).   Medications:  Scheduled:  . antiseptic oral rinse  7 mL Mouth Rinse BID  . azithromycin  500 mg Intravenous Q24H  . calcium-vitamin D  1 tablet Oral Daily  . cefTRIAXone (ROCEPHIN)  IV  1 g Intravenous Q24H  . dorzolamide-timolol  1 drop Both Eyes BID  . Warfarin - Pharmacist Dosing Inpatient   Does not apply q1800    Assessment: 81 yo F admitted 7/21 after several episodes of N/V and possible syncopal event.  Pt on Coumadin PTA for hx afib and was restarted on home regimen.  INR 7/23 = 3.6.  No bleeding noted. CBC stable   Goal of Therapy:  INR 2-3 Monitor platelets by anticoagulation protocol: Yes   Plan:  No Coumadin again today. Continue daily INR.  Cris Talavera C. Marvis Moeller, PharmD Pharmacy Resident  Pager: [phone]

## 2015-02-27 NOTE — Progress Notes (Signed)
PROGRESS NOTE  [name] ZOX:[id number] DOB: 06/10/1920 DOA: 02/25/2015 PCP: Ezequiel Kayser, MD  Brief history 81 year old female with a history of permanent atrial fibrillation, diastolic CHF presents with multiple episodes of nausea and vomiting followed by a questionable syncopal episode. On the day of admission, the patient had numerous episodes of emesis without blood. The patient went back to her bedroom to rest. Her daughter apparently, heard some kind of noise and came to check on her mom at which time she found the patient sitting on the floor awake and conversant. The patient denied any loss of consciousness, and was unclear whether the patient actually passed out. Subsequent, the patient had a brief episode where she lost tone in her neck, but upon calling her name, the patient responded appropriately. Prior to the events on the day of admission, the patient had been in her usual state of health without any worsening shortness of breath, chest discomfort, fevers, chills, diarrhea, abdominal pain, dysuria, hematuria. Evaluation in the emergency department revealed right lower lobe and right middle lobe opacity with low-grade temperature up to 99.28F. Assessment/Plan: Pneumonia/Sepsis -Likely community-acquired pneumonia although there is some concern of possible aspiration given the patient's numerous episodes of vomiting -Continue azithromycin and ceftriaxone for now and monitor clinically -urine legionella antigen, urine strept pneumo antigen -lactic acid 2.3-->0.9 Hypotension -7/22--SBP was in 70s -give fluid bolus and start IVF-->improved -PCT--0.89 Transient loss of consciousness -unclear if pt truly had syncopal episode -likely vasovagal/volume depletion due to pt's numerous episodes of vomiting -echo N/V -LFTs normal -UA without pyuria -no abdominal pain -?gastritis -improved -advance diet  Permanent atrial fibrillation -Rate controlled -Continue  warfarin Chronic diastolic CHF -Clinically compensated -Monitor on fluids-->saline locked -Hold furosemide -Repeat chest x-ray today--oxygen saturation 93% on room air Thrombocytopenia -Likely due to the patient's chronic warfarin use -Drop in platelets likely due to infectious process -Monitor for signs of bleeding   Family Communication: Daughter updated at beside 7/23 Disposition Plan: Home when medically stable    Procedures/Studies: Dg Chest 2 View  02/25/2015   CLINICAL DATA:  Sudden onset nausea and vomiting.  Syncope.  EXAM: CHEST  2 VIEW  COMPARISON:  06/08/2014.  FINDINGS: Interval patchy opacity in the right middle lobe and right lower lobe, medially. Small bilateral pleural effusions, improved on the right. Stable enlarged cardiac silhouette, hyperexpanded lungs and mildly prominent interstitial markings. Stable thoracic spine compression deformities of acute kyphosis. Diffuse osteopenia. Cholecystectomy clips.  IMPRESSION: 1. Right lower lobe and right middle lobe pneumonia. No underlying mass cannot be excluded. 2. Small right pleural effusion. 3. Stable cardiomegaly and COPD. 4. Stable multiple thoracic vertebral compression deformities and acute kyphosis.   Electronically Signed   By: Beckie Salts M.D.   On: 02/25/2015 19:10   Ct Head Wo Contrast  02/25/2015   CLINICAL DATA:  Witnessed brief loss of consciousness, slid to ground, history atrial fibrillation  EXAM: CT HEAD WITHOUT CONTRAST  TECHNIQUE: Contiguous axial images were obtained from the base of the skull through the vertex without intravenous contrast.  COMPARISON:  None  FINDINGS: Generalized atrophy.  Normal ventricular morphology.  No midline shift or mass effect.  Small vessel chronic ischemic changes of deep cerebral white matter.  Otherwise normal appearance of brain parenchyma.  No intracranial hemorrhage, mass lesion, or acute infarction.  Visualized paranasal sinuses clear.  Bones unremarkable.   Atherosclerotic calcifications at the carotid siphons.  IMPRESSION: Atrophy with small vessel chronic ischemic changes  of deep cerebral white matter.  No acute intracranial abnormalities.   Electronically Signed   By: Ulyses Southward M.D.   On: 02/25/2015 16:47         Subjective: Patient still has some dyspnea on exertion, but overall breathing is worse. Denies any fevers, chills, chest discomfort, nausea, vomiting, diarrhea, vomiting, and medication, melena. No hemoptysis.  Objective: Filed Vitals:   02/26/15 2158 02/27/15 0559 02/27/15 0828 02/27/15 0855  BP: 104/61 134/65    Pulse: 65 59 64 68  Temp: 99.8 F (37.7 C) 98.8 F (37.1 C)    TempSrc: Oral Oral    Resp: 18 18    Height:      Weight:      SpO2: 91% 90% 90% 87%    Intake/Output Summary (Last 24 hours) at 02/27/15 0946 Last data filed at 02/27/15 0900  Gross per 24 hour  Intake 2417.5 ml  Output   1050 ml  Net 1367.5 ml   Weight change:  Exam:   General:  Pt is alert, follows commands appropriately, not in acute distress  HEENT: No icterus, No thrush, No neck mass, Wilson/AT  Cardiovascular: RRR, S1/S2, no rubs, no gallops  Respiratory: Bibasilar crackles. Diminished right base. No wheezing.  Abdomen: Soft/+BS, non tender, non distended, no guarding; no hepatomegaly  Extremities: No edema, No lymphangitis, No petechiae, No rashes, no synovitis; no cyanosis or clubbing   Data Reviewed: Basic Metabolic Panel:  Recent Labs Lab 02/25/15 1445 02/26/15 0451 02/27/15 0430  NA 140 138 135  K 4.3 4.1 4.2  CL 101 97* 102  CO2 31 34* 26  GLUCOSE 132* 113* 106*  BUN 26* 29* 23*  CREATININE 0.75 0.95 0.69  CALCIUM 9.2 8.4* 8.1*   Liver Function Tests:  Recent Labs Lab 02/25/15 1445  AST 31  ALT 17  ALKPHOS 95  BILITOT 1.1  PROT 6.3*  ALBUMIN 3.5   No results for input(s): LIPASE, AMYLASE in the last 168 hours. No results for input(s): AMMONIA in the last 168 hours. CBC:  Recent Labs Lab  02/25/15 1445 02/26/15 0451 02/27/15 0430  WBC 9.6 9.1 8.6  NEUTROABS 8.5*  --   --   HGB 12.6 11.2* 11.0*  HCT 39.3 35.4* 34.5*  MCV 95.4 96.5 96.4  PLT 114* 109* 104*   Cardiac Enzymes:  Recent Labs Lab 02/25/15 1445  TROPONINI <0.03   BNP: Invalid input(s): POCBNP CBG: No results for input(s): GLUCAP in the last 168 hours.  Recent Results (from the past 240 hour(s))  Urine culture     Status: None   Collection Time: 02/25/15  3:57 PM  Result Value Ref Range Status   Specimen Description URINE, RANDOM  Final   Special Requests NONE  Final   Culture MULTIPLE SPECIES PRESENT, SUGGEST RECOLLECTION  Final   Report Status 02/26/2015 FINAL  Final     Scheduled Meds: . antiseptic oral rinse  7 mL Mouth Rinse BID  . azithromycin  500 mg Intravenous Q24H  . calcium-vitamin D  1 tablet Oral Daily  . cefTRIAXone (ROCEPHIN)  IV  1 g Intravenous Q24H  . dorzolamide-timolol  1 drop Both Eyes BID  . Warfarin - Pharmacist Dosing Inpatient   Does not apply q1800   Continuous Infusions:    Shereda Graw, DO  [facility] Pager [phone]  If 7PM-7AM, please contact night-coverage [email or web address] Password [facility] 02/27/2015, 9:46 AM   LOS: 1 day

## 2015-02-28 LAB — BASIC METABOLIC PANEL
Anion gap: 7 (ref 5–15)
BUN: 18 mg/dL (ref 6–20)
CO2: 25 mmol/L (ref 22–32)
Calcium: 8.2 mg/dL — ABNORMAL LOW (ref 8.9–10.3)
Chloride: 100 mmol/L — ABNORMAL LOW (ref 101–111)
Creatinine, Ser: 0.56 mg/dL (ref 0.44–1.00)
GFR calc Af Amer: >60 mL/min (ref >60)
Glucose, Bld: 105 mg/dL — ABNORMAL HIGH (ref 65–99)
Potassium: 3.7 mmol/L (ref 3.5–5.1)
Sodium: 132 mmol/L — ABNORMAL LOW (ref 135–145)

## 2015-02-28 LAB — PROTIME-INR
INR: 2.1 — ABNORMAL HIGH (ref 0.00–1.49)
Prothrombin Time: 23.4 seconds — ABNORMAL HIGH (ref 11.6–15.2)

## 2015-02-28 LAB — PROCALCITONIN: PROCALCITONIN: 0.31 ng/mL

## 2015-02-28 MED ORDER — CEFDINIR 300 MG PO CAPS
300.0000 mg | ORAL_CAPSULE | Freq: Two times a day (BID) | ORAL | Status: DC
Start: 2015-03-01 — End: 2015-05-10

## 2015-02-28 MED ORDER — AZITHROMYCIN 500 MG PO TABS: 500.0000 mg | ORAL_TABLET | Freq: Every day | ORAL | Status: DC

## 2015-02-28 NOTE — Progress Notes (Signed)
Pt verbalized understanding of discharge instruction. Pt IV dc with no complications. Pt education given Pt instructed when to follow up with physician and when their follow up appointments are. Pt dc with daughter and all belongings. Reuben Likes, RN

## 2015-03-01 LAB — LEGIONELLA ANTIGEN, URINE

## 2015-03-01 NOTE — Patient Outreach (Signed)
[facility]) Care Management  03/01/2015  [name] [birth year] [id number]   Referral from Charlesetta Shanks, RN to assign Community RN, Wyatt Haste, RN assigned.  Corrie Mckusick. Sharlee Blew [facility] Care Management [facility] CM Assistant Phone: [phone] Fax: [phone]

## 2015-03-02 ENCOUNTER — Other Ambulatory Visit: Payer: Self-pay | Admitting: *Deleted

## 2015-03-02 ENCOUNTER — Ambulatory Visit (INDEPENDENT_AMBULATORY_CARE_PROVIDER_SITE_OTHER): Payer: Medicare Other | Admitting: *Deleted

## 2015-03-02 DIAGNOSIS — I4891 Unspecified atrial fibrillation: Secondary | ICD-10-CM

## 2015-03-02 DIAGNOSIS — Z5181 Encounter for therapeutic drug level monitoring: Secondary | ICD-10-CM

## 2015-03-02 LAB — POCT INR: INR: 1.7

## 2015-03-02 NOTE — Patient Outreach (Signed)
[facility]) Care Management  03/02/2015  MALLOREE RABOIN 1920-04-30 [id number]   Assessment: Initial transition of care call Call made for initial transition of care but unable to reach patient or daughter Arline Asp- emergency contact). Care management coordinator left HIPAA compliant message with name and contact information.  Plan: Care management coordinator will schedule for next outreach call to follow-up initial transition of care call.  Chandra Asher A. [place] Sarinana, BSN, RN-BC [facility] Community Care Management Coordinator Cell: [phone]

## 2015-03-03 ENCOUNTER — Other Ambulatory Visit: Payer: Self-pay | Admitting: *Deleted

## 2015-03-03 NOTE — Patient Outreach (Addendum)
[facility]) Care Management  03/03/2015  [name] 1920/02/22 [id number]   Assessment: Transition of care call-- second attempt Call placed to patient and daughter but unable to reach. Care management coordinator left HIPAA compliant message and contact information.  Plan: Will schedule for next outreach call.   Addendum:  Received voice message twice from patient's daughter Carah Barrientes) today asking to remove patient from the program and also states "not going to participate in the program because it is not needed", as per daughter.  Plan: Will remove patient from the program per daughter's request and will close case. Will notify primary care provider of case closure    Almarie Kurdziel A. Elleana Stillson, BSN, RN-BC [facility] Community Care Management Coordinator Cell: [phone]

## 2015-03-04 ENCOUNTER — Other Ambulatory Visit: Payer: Self-pay | Admitting: *Deleted

## 2015-03-04 NOTE — Patient Outreach (Signed)
[facility]) Care Management  03/04/2015  TELMA PYEATT 20-Feb-1920 [id number]   Assessment: Transition of care call  Spoke with hospital liaison Mike Gip) earlier regarding this patient and was informed that she was able to speak with patient's daughter who reported that patient is doing really good per doctor and she's actually "better than she was" with home health in place. Also, daughter told her she would like "to opt out" from the program and will keep our information in case our services will be needed in the future.    Call placed to patient and daughter but no answer. Message left letting daughter know that her message was received by this care management coordinator and thanking her for efforts of calling us back. Contact information left for this case management coordinator and 24 hour nurse line and encourage her to call back as need arises.    Plan: Will close case. Will notify primary care provider of case closure

## 2015-03-08 NOTE — Patient Outreach (Signed)
[facility]) Care Management  03/08/2015  AUBRII SHARPLESS Mar 30, 1920 [id number]   Notification from Mliss Sax, RN to close case due to patient called and asked to be taken out of [facility] Care Management program.  Corrie Mckusick. Sharlee Blew [facility] Care Management [facility] CM Assistant Phone: [phone] Fax: [phone]

## 2015-03-15 NOTE — ED Provider Notes (Signed)
CSN: [id number]     Arrival date & time 02/25/15  1322 History   First MD Initiated Contact with Patient 02/25/15 1326     Chief Complaint  Patient presents with  . Loss of Consciousness     (Consider location/radiation/quality/duration/timing/severity/associated sxs/prior Treatment) Patient is a 81 y.o. female presenting with syncope. The history is provided by the patient. No language interpreter was used.  Loss of Consciousness Episode history:  Single Associated symptoms: nausea and vomiting   Associated symptoms: no chest pain, no fever, no headaches and no shortness of breath   Associated symptoms comment:  Patient with history of atrial fibrillation on coumadin, arrives with complaint of vomiting multiple times with subsequent syncopal episode. She denies chest pain, SOB, recent illness or fever, or diarrhea. No hematemesis, "mostly green stuff". She slide to the floor in the bathroom when she had a brief syncopal episode and denies injury in the fall. No headache.    Past Medical History  Diagnosis Date  . Osteoporosis   . Atrial Fibrillation and Flutter     a. chronic coumadin;  b. 10/2004 Echo: EF 55-65%, no rwma, mild AI, mild-mod TR.  . Glaucoma   . Osteoporosis   . Hard of hearing     bilateral hearing aids  . Pleural effusion, right     a. 05/2014  . Permanent atrial fibrillation    Past Surgical History  Procedure Laterality Date  . Fixation kyphoplasty      back  . Cholecystectomy    . Glaucoma surgery  09/17/12   Family History  Problem Relation Age of Onset  . Cancer Mother   . Cancer Father    History  Substance Use Topics  . Smoking status: Never Smoker   . Smokeless tobacco: Never Used  . Alcohol Use: 0.0 oz/week    0 Standard drinks or equivalent per week     Comment: when I eat pizza, I have a half a beer   OB History    No data available     Review of Systems  Constitutional: Negative for fever and chills.  HENT: Negative.   Eyes:  Negative.  Negative for visual disturbance.  Respiratory: Positive for cough. Negative for shortness of breath.        Cough is usual cough.  Cardiovascular: Positive for syncope. Negative for chest pain.  Gastrointestinal: Positive for nausea and vomiting. Negative for abdominal pain and diarrhea.  Genitourinary: Negative.  Negative for dysuria.  Musculoskeletal: Negative.  Negative for myalgias.  Skin: Negative.  Negative for wound.  Neurological: Positive for syncope. Negative for headaches.      Allergies  Sulfa antibiotics and Risedronate sodium  Home Medications   Prior to Admission medications   Medication Sig Start Date End Date Taking? Authorizing Provider  calcium-vitamin D (OSCAL WITH D) 500-200 MG-UNIT per tablet Take 1 tablet by mouth daily.   Yes Historical Provider, MD  dorzolamide-timolol (COSOPT) 22.3-6.8 MG/ML ophthalmic solution Place 1 drop into both eyes 2 (two) times daily.    Yes Historical Provider, MD  fluticasone (FLONASE) 50 MCG/ACT nasal spray Place 2 sprays into the nose as needed for rhinitis.   Yes Historical Provider, MD  furosemide (LASIX) 20 MG tablet Take 1 tablet (20 mg total) by mouth daily. 06/02/14  Yes Ok Anis, NP  Vitamin D, Ergocalciferol, (DRISDOL) 50000 UNITS CAPS Take 50,000 Units by mouth every 14 (fourteen) days.    Yes Historical Provider, MD  warfarin (COUMADIN) 5 MG tablet TAKE  ONE-HALF (1/2) TABLET EVERY DAY OR AS DIRECTED BY COUMADIN CLINIC Patient taking differently: 1.25 mg on Friday, 2.5 mg all other days every evening. 11/06/14  Yes Lyn Records, MD  zoledronic acid (RECLAST) 5 MG/100ML SOLN Inject 5 mg into the vein once. Once a year.   Yes Historical Provider, MD  azithromycin (ZITHROMAX) 500 MG tablet Take 1 tablet (500 mg total) by mouth daily. Start 03/01/15 03/01/15   Catarina Hartshorn, MD  cefdinir (OMNICEF) 300 MG capsule Take 1 capsule (300 mg total) by mouth 2 (two) times daily. Start 03/01/15 03/01/15   Catarina Hartshorn, MD    BP 132/63 mmHg  Pulse 71  Temp(Src) 98.8 F (37.1 C) (Oral)  Resp 18  Ht 4\' 11"  (1.499 m)  Wt 90 lb 2.7 oz (40.9 kg)  BMI 18.20 kg/m2  SpO2 96% Physical Exam  Constitutional: She is oriented to person, place, and time. She appears well-developed and well-nourished.  HENT:  Head: Normocephalic.  Eyes: Pupils are equal, round, and reactive to light.  Neck: Normal range of motion. Neck supple.  Cardiovascular: Normal rate and regular rhythm.   Pulmonary/Chest: Effort normal and breath sounds normal.  Abdominal: Soft. Bowel sounds are normal. There is no tenderness. There is no rebound and no guarding.  Musculoskeletal: Normal range of motion.  Neurological: She is alert and oriented to person, place, and time. She has normal strength and normal reflexes. No sensory deficit. She displays a negative Romberg sign. Coordination normal.  Speech clear, focused and oriented. CN's 3-12 intact. No coordination deficits or lateralizing weakness. Ambulatory with assistance to bathroom - no apparent ataxia or imbalance.   Skin: Skin is warm and dry. No rash noted.  Psychiatric: She has a normal mood and affect.    ED Course  Procedures (including critical care time) Labs Review Labs Reviewed  CBC WITH DIFFERENTIAL/PLATELET - Abnormal; Notable for the following:    Platelets 114 (*)    Neutrophils Relative % 89 (*)    Neutro Abs 8.5 (*)    Lymphocytes Relative 4 (*)    Lymphs Abs 0.4 (*)    All other components within normal limits  COMPREHENSIVE METABOLIC PANEL - Abnormal; Notable for the following:    Glucose, Bld 132 (*)    BUN 26 (*)    Total Protein 6.3 (*)    All other components within normal limits  PROTIME-INR - Abnormal; Notable for the following:    Prothrombin Time 27.9 (*)    INR 2.66 (*)    All other components within normal limits  URINALYSIS, ROUTINE W REFLEX MICROSCOPIC (NOT AT [facility]) - Abnormal; Notable for the following:    Hgb urine dipstick TRACE (*)    Protein,  ur 30 (*)    All other components within normal limits  URINE MICROSCOPIC-ADD ON - Abnormal; Notable for the following:    Squamous Epithelial / LPF FEW (*)    Bacteria, UA FEW (*)    All other components within normal limits  BASIC METABOLIC PANEL - Abnormal; Notable for the following:    Chloride 97 (*)    CO2 34 (*)    Glucose, Bld 113 (*)    BUN 29 (*)    Calcium 8.4 (*)    GFR calc non Af Amer 50 (*)    GFR calc Af Amer 58 (*)    All other components within normal limits  CBC - Abnormal; Notable for the following:    RBC 3.67 (*)    Hemoglobin  11.2 (*)    HCT 35.4 (*)    Platelets 109 (*)    All other components within normal limits  LACTIC ACID, PLASMA - Abnormal; Notable for the following:    Lactic Acid, Venous 2.3 (*)    All other components within normal limits  LACTIC ACID, PLASMA - Abnormal; Notable for the following:    Lactic Acid, Venous 2.1 (*)    All other components within normal limits  BRAIN NATRIURETIC PEPTIDE - Abnormal; Notable for the following:    B Natriuretic Peptide 485.3 (*)    All other components within normal limits  PROTIME-INR - Abnormal; Notable for the following:    Prothrombin Time 36.1 (*)    INR 3.74 (*)    All other components within normal limits  CBC - Abnormal; Notable for the following:    RBC 3.58 (*)    Hemoglobin 11.0 (*)    HCT 34.5 (*)    Platelets 104 (*)    All other components within normal limits  BASIC METABOLIC PANEL - Abnormal; Notable for the following:    Glucose, Bld 106 (*)    BUN 23 (*)    Calcium 8.1 (*)    All other components within normal limits  PROTIME-INR - Abnormal; Notable for the following:    Prothrombin Time 35.1 (*)    INR 3.60 (*)    All other components within normal limits  PROTIME-INR - Abnormal; Notable for the following:    Prothrombin Time 23.4 (*)    INR 2.10 (*)    All other components within normal limits  BASIC METABOLIC PANEL - Abnormal; Notable for the following:    Sodium 132  (*)    Chloride 100 (*)    Glucose, Bld 105 (*)    Calcium 8.2 (*)    All other components within normal limits  URINE CULTURE  TROPONIN I  LACTIC ACID, PLASMA  PROCALCITONIN  LEGIONELLA ANTIGEN, URINE  STREP PNEUMONIAE URINARY ANTIGEN  LACTIC ACID, PLASMA  CORTISOL-AM, BLOOD  PROCALCITONIN    Imaging Review No results found.   EKG Interpretation   Date/Time:  Thursday February 25 2015 13:33:22 EDT Ventricular Rate:  79 PR Interval:    QRS Duration: 89 QT Interval:  416 QTC Calculation: 477 R Axis:   -79 Text Interpretation:  Atrial fibrillation Left anterior fascicular block  Abnormal R-wave progression, late transition Borderline T abnormalities,  lateral leads Confirmed by COOK  MD, BRIAN ([id number]) on 02/25/2015 2:32:24 PM      MDM   Final diagnoses:  Syncope, unspecified syncope type  Cough    81 yo with episode of syncope, likely vasovagal after vomiting. No injury from fall, syncope witnessed by daughter. Brief in duration. No chest pain. Labs reassuring, EKG in a-fib without RVR. Head CT negative. Will admit for further observation given age and medical history. Patient comfortable with care plan.     Elpidio Anis, PA-C 03/15/15 1536  Donnetta Hutching, MD 03/16/15 [phone]

## 2015-03-16 ENCOUNTER — Ambulatory Visit (INDEPENDENT_AMBULATORY_CARE_PROVIDER_SITE_OTHER): Payer: Medicare Other

## 2015-03-16 DIAGNOSIS — I4891 Unspecified atrial fibrillation: Secondary | ICD-10-CM | POA: Diagnosis not present

## 2015-03-16 DIAGNOSIS — Z5181 Encounter for therapeutic drug level monitoring: Secondary | ICD-10-CM

## 2015-03-16 LAB — POCT INR: INR: 2.3

## 2015-04-13 ENCOUNTER — Ambulatory Visit (INDEPENDENT_AMBULATORY_CARE_PROVIDER_SITE_OTHER): Payer: Medicare Other | Admitting: Pharmacist Clinician (PhC)/ Clinical Pharmacy Specialist

## 2015-04-13 DIAGNOSIS — Z5181 Encounter for therapeutic drug level monitoring: Secondary | ICD-10-CM | POA: Diagnosis not present

## 2015-04-13 DIAGNOSIS — I4891 Unspecified atrial fibrillation: Secondary | ICD-10-CM | POA: Diagnosis not present

## 2015-04-13 LAB — POCT INR: INR: 2.1

## 2015-04-15 ENCOUNTER — Other Ambulatory Visit: Payer: Self-pay | Admitting: Interventional Cardiology

## 2015-04-21 ENCOUNTER — Other Ambulatory Visit: Payer: Self-pay | Admitting: Nurse Practitioner

## 2015-05-10 ENCOUNTER — Ambulatory Visit (INDEPENDENT_AMBULATORY_CARE_PROVIDER_SITE_OTHER): Payer: Medicare Other | Admitting: *Deleted

## 2015-05-10 DIAGNOSIS — Z5181 Encounter for therapeutic drug level monitoring: Secondary | ICD-10-CM

## 2015-05-10 DIAGNOSIS — I4891 Unspecified atrial fibrillation: Secondary | ICD-10-CM

## 2015-05-10 LAB — POCT INR: INR: 2.4

## 2015-05-18 ENCOUNTER — Other Ambulatory Visit: Payer: Self-pay | Admitting: Internal Medicine

## 2015-05-18 ENCOUNTER — Ambulatory Visit
Admission: RE | Admit: 2015-05-18 | Discharge: 2015-05-18 | Disposition: A | Discharge status: Home / Self Care | Payer: Medicare Other | Source: Ambulatory Visit | Attending: Internal Medicine | Admitting: Internal Medicine

## 2015-05-18 DIAGNOSIS — M5489 Other dorsalgia: Secondary | ICD-10-CM

## 2015-05-24 ENCOUNTER — Encounter: Payer: Self-pay | Admitting: Physician Assistant

## 2015-06-01 NOTE — Progress Notes (Signed)
Cardiology Office Note   Date:  06/02/2015   ID:  Sarah PongFrances D Laughery, DOB [birth year], MRN [id number]  PCP:  Ezequiel KayserPERINI,MARK A, MD  Cardiologist:  Dr. Verdis PrimeHenry Smith   Electrophysiologist:  n/a  Chief Complaint  Patient presents with  . Follow-up  . Atrial Fibrillation  . Congestive Heart Failure     History of Present Illness: [name] is a 81 y.o. female with a hx of chronic AFib/Flutter, diastolic HF with assoc prior R pleural effusion.  Last seen by Dr. Verdis PrimeHenry Smith 7/16.  She had a recent 10 pound weight gain at home and her Lasix was increased. She was seen back by primary care. Her breathing had worsened as well as or LE edema. Since that time, her weight has improved. Today her weight at home was 89.2 pounds. She feels best at 85 pounds. She denies orthopnea or PND. Her breathing is back to baseline. She still has some mild leg edema but this is improved. She denies chest pain or syncope.   Studies/Reports Reviewed Today:  Echo 11/15 EF 60-65%, no RWMA, mild to mod AI, MVP involving posterior leaflet, mod MR, severe LAE, mild RVE, mod RVSF, mild RAE, mod TR, PASP 52 mmHg    Past Medical History  Diagnosis Date  . Osteoporosis   . Atrial Fibrillation and Flutter     a. chronic coumadin;  b. 10/2004 Echo: EF 55-65%, no rwma, mild AI, mild-mod TR.  . Glaucoma   . Osteoporosis   . Hard of hearing     bilateral hearing aids  . Pleural effusion, right     a. 05/2014  . Permanent atrial fibrillation [facility](HCC)     Past Surgical History  Procedure Laterality Date  . Fixation kyphoplasty      back  . Cholecystectomy    . Glaucoma surgery  09/17/12     Current Outpatient Prescriptions  Medication Sig Dispense Refill  . calcium-vitamin D (OSCAL WITH D) 500-200 MG-UNIT per tablet Take 1 tablet by mouth daily.    . dorzolamide-timolol (COSOPT) 22.3-6.8 MG/ML ophthalmic solution Place 1 drop into both eyes 2 (two) times daily.     . fluticasone (FLONASE) 50 MCG/ACT nasal  spray Place 2 sprays into the nose as needed for rhinitis.    . furosemide (LASIX) 20 MG tablet Take 20 mg by mouth 2 (two) times daily.    . Lactobacillus (FLORAJEN ACIDOPHILUS PO) Take 1 tablet by mouth daily.     . Vitamin D, Ergocalciferol, (DRISDOL) 50000 UNITS CAPS Take 50,000 Units by mouth every 14 (fourteen) days.     Marland Kitchen. warfarin (COUMADIN) 5 MG tablet TAKE ONE-HALF (1/2) TABLET DAILY OR AS DIRECTED BY COUMADIN CLINIC 50 tablet 0  . zoledronic acid (RECLAST) 5 MG/100ML SOLN Inject 5 mg into the vein once. Once a year.     No current facility-administered medications for this visit.    Allergies:   Sulfa antibiotics and Risedronate sodium    Social History:  The patient  reports that she has never smoked. She has never used smokeless tobacco. She reports that she drinks alcohol.   Family History:  The patient's family history includes Cancer in her father and mother.    ROS:   Please see the history of present illness.   Review of Systems  Cardiovascular: Positive for dyspnea on exertion and leg swelling.  Hematologic/Lymphatic: Bruises/bleeds easily.  Musculoskeletal: Positive for back pain.  All other systems reviewed and are negative.     PHYSICAL  EXAM: VS:  BP 124/42 mmHg  Pulse 64  Ht  (1.499 m)  Wt 90 lb 1.9 oz (40.878 kg)  BMI 18.19 kg/m2  SpO2 95%    Wt Readings from Last 3 Encounters:  06/02/15 90 lb 1.9 oz (40.878 kg)  02/28/15 90 lb 2.7 oz (40.9 kg)  02/16/15 84 lb 6.4 oz (38.284 kg)     GEN: Well nourished, well developed, in no acute distress HEENT: normal Neck: JVP 6 cm,   no masses Cardiac:  Normal S1/S2, RRR; no murmur, no rubs or gallops, trace-1+ bilateral LE edema   Respiratory: Decreased breath sounds bilaterally, no wheezing, rhonchi or rales. GI: soft, nontender, nondistended, + BS MS: no deformity or atrophy Skin: warm and dry  Neuro:  CNs II-XII intact, Strength and sensation are intact Psych: Normal affect   EKG:  EKG is  ordered today.  It demonstrates:   Atrial fibrillation, HR 64, PVCs   Recent Labs: 02/25/2015: ALT 17 02/26/2015: B Natriuretic Peptide 485.3* 02/27/2015: Hemoglobin 11.0*; Platelets 104* 02/28/2015: BUN 18; Creatinine, Ser 0.56; Potassium 3.7; Sodium 132*    Lipid Panel No results found for: CHOL, TRIG, HDL, CHOLHDL, VLDL, LDLCALC, LDLDIRECT    ASSESSMENT AND PLAN:  1. Acute on Chronic Diastolic CHF:  She recently had up to a 10 pound weight gain at home. She increased her Lasix to 40 mg daily and her weight is closer to baseline. Symptomatically, she is improved. Her lung exam is clear. JVP is still elevated and she still has some residual LE edema. At this point, given her frailty, I am inclined to keep her on her current dose of Lasix. I will repeat her BMET today and check a BNP. If her BNP remains elevated, I will further adjust her Lasix.  2. Chronic AFib:  Rate controlled. She remains on Coumadin.    Medication Changes: Current medicines are reviewed at length with the patient today.  Concerns regarding medicines are as outlined above.  The following changes have been made:   Discontinued Medications   FUROSEMIDE (LASIX) 20 MG TABLET    Take 1 tablet (20 mg total) by mouth daily.   Modified Medications   No medications on file   New Prescriptions   No medications on file   Labs/ tests ordered today include:   Orders Placed This Encounter  Procedures  . Basic Metabolic Panel (BMET)  . B Nat Peptide  . EKG 12-Lead     Disposition:    FU with me in one month and Dr. Katrinka Blazing in 1/17 as planned.    Signed, Brynda Rim, MHS 06/02/2015 3:16 PM    [facility] [address] [place], [place], [place]  [address] Phone: [phone]; Fax: [phone]

## 2015-06-02 ENCOUNTER — Ambulatory Visit (INDEPENDENT_AMBULATORY_CARE_PROVIDER_SITE_OTHER): Payer: Medicare Other | Admitting: Physician Assistant

## 2015-06-02 ENCOUNTER — Encounter: Payer: Self-pay | Admitting: Physician Assistant

## 2015-06-02 VITALS — BP 124/42 | HR 64 | Ht 59.0 in | Wt 90.1 lb

## 2015-06-02 DIAGNOSIS — I5033 Acute on chronic diastolic (congestive) heart failure: Secondary | ICD-10-CM | POA: Diagnosis not present

## 2015-06-02 DIAGNOSIS — I482 Chronic atrial fibrillation, unspecified: Secondary | ICD-10-CM

## 2015-06-02 LAB — BASIC METABOLIC PANEL WITH GFR
BUN: 22 mg/dL (ref 7–25)
CO2: 31 mmol/L (ref 20–31)
Calcium: 9.8 mg/dL (ref 8.6–10.4)
Chloride: 99 mmol/L (ref 98–110)
Creat: 0.58 mg/dL — ABNORMAL LOW (ref 0.60–0.88)
Glucose, Bld: 122 mg/dL — ABNORMAL HIGH (ref 65–99)
Potassium: 4 mmol/L (ref 3.5–5.3)
Sodium: 140 mmol/L (ref 135–146)

## 2015-06-02 NOTE — Patient Instructions (Addendum)
Medication Instructions:  Your physician recommends that you continue on your current medications as directed. Please refer to the Current Medication list given to you today.   Labwork: TODAY BMET, BNP  Testing/Procedures: NONE  Follow-Up: [facility]  [facility] WITH DR. Katrinka BlazingSMITH; PLEASE SCHEDULE  Any Other Special Instructions Will Be Listed Below (If Applicable).   If you need a refill on your cardiac medications before your next appointment, please call your pharmacy.

## 2015-06-03 ENCOUNTER — Telehealth: Payer: Self-pay | Admitting: *Deleted

## 2015-06-03 DIAGNOSIS — I5032 Chronic diastolic (congestive) heart failure: Secondary | ICD-10-CM

## 2015-06-03 LAB — BRAIN NATRIURETIC PEPTIDE: Brain Natriuretic Peptide: 349.6 pg/mL — ABNORMAL HIGH (ref 0.0–100.0)

## 2015-06-03 NOTE — Telephone Encounter (Signed)
DPR on file of daughter in law Keenesindy. Lmptcb to go over lab results and med changes.

## 2015-06-04 NOTE — Telephone Encounter (Signed)
Follow up ° ° ° ° °Returned Carol's call. °

## 2015-06-04 NOTE — Telephone Encounter (Signed)
DPR on file for daughter Arline AspCindy. cindy notified of lab results and lasix changes by phone w/verbal understanding. BMET 11/4. Increase lasix 40 mg AM/20 mg PM x 3 days; resume lasix 20 mg BID after 3 days. Eat 1 extra banana over one of the next 3 days.

## 2015-06-11 ENCOUNTER — Other Ambulatory Visit (INDEPENDENT_AMBULATORY_CARE_PROVIDER_SITE_OTHER): Payer: Medicare Other | Admitting: *Deleted

## 2015-06-11 DIAGNOSIS — I5032 Chronic diastolic (congestive) heart failure: Secondary | ICD-10-CM

## 2015-06-11 LAB — BASIC METABOLIC PANEL
BUN: 27 mg/dL — ABNORMAL HIGH (ref 7–25)
CALCIUM: 9.3 mg/dL (ref 8.6–10.4)
CO2: 31 mmol/L (ref 20–31)
CREATININE: 0.81 mg/dL (ref 0.60–0.88)
Chloride: 97 mmol/L — ABNORMAL LOW (ref 98–110)
GLUCOSE: 80 mg/dL (ref 65–99)
Potassium: 4.1 mmol/L (ref 3.5–5.3)
SODIUM: 133 mmol/L — AB (ref 135–146)

## 2015-06-15 ENCOUNTER — Telehealth: Payer: Self-pay | Admitting: *Deleted

## 2015-06-15 NOTE — Telephone Encounter (Signed)
DPR on file for daughter Arline AspCindy. Notified of lab results. Arline AspCindy stated pt's weight are doing well. I did advise to keep an eye on weight and call if wt is up 3 lb's x 1 day. Advised keep 11/21 appt w/PA, Cindy verbalized understanding to plan of care.

## 2015-06-21 ENCOUNTER — Ambulatory Visit (INDEPENDENT_AMBULATORY_CARE_PROVIDER_SITE_OTHER): Payer: Medicare Other | Admitting: *Deleted

## 2015-06-21 DIAGNOSIS — Z5181 Encounter for therapeutic drug level monitoring: Secondary | ICD-10-CM | POA: Diagnosis not present

## 2015-06-21 DIAGNOSIS — I4891 Unspecified atrial fibrillation: Secondary | ICD-10-CM | POA: Diagnosis not present

## 2015-06-21 LAB — POCT INR: INR: 1.9

## 2015-06-27 NOTE — Progress Notes (Signed)
Cardiology Office Note   Date:  06/28/2015   ID:  [name], DOB 23-May-1920, MRN [id number]  Patient Care Team: Rodrigo Ran, MD as PCP - General Lyn Records, MD as Consulting Physician (Cardiology)    Chief Complaint  Patient presents with  . Follow-up  . Congestive Heart Failure     History of Present Illness: [name] is a 81 y.o. female with a hx of chronic AFib/Flutter, diastolic HF with assoc prior R pleural effusion.  Last seen by Dr. Verdis Prime 7/16.  She had a recent 10 pound weight gain at home and her Lasix was increased.  I saw her 10/26.  I adjusted her Lasix for a few days more when her BNP returned elevated.  She was 89 bronchospasm and she told me that she feels best at 85 pounds.   Returns for FU. Here with her daughter. She is doing well. Weights at home 83 lbs. Her breathing is stable.  LE edema is resolved.  No chest pain, orthopnea, PND, syncope. No coughing or wheezing.    Studies/Reports Reviewed Today:  Echo 11/15 EF 60-65%, no RWMA, mild to mod AI, MVP involving posterior leaflet, mod MR, severe LAE, mild RVE, mod RVSF, mild RAE, mod TR, PASP 52 mmHg    Past Medical History  Diagnosis Date  . Osteoporosis   . Atrial Fibrillation and Flutter     a. chronic coumadin;  b. 10/2004 Echo: EF 55-65%, no rwma, mild AI, mild-mod TR.  . Glaucoma   . Osteoporosis   . Hard of hearing     bilateral hearing aids  . Pleural effusion, right     a. 05/2014  . Permanent atrial fibrillation [facility])     Past Surgical History  Procedure Laterality Date  . Fixation kyphoplasty      back  . Cholecystectomy    . Glaucoma surgery  09/17/12     Current Outpatient Prescriptions  Medication Sig Dispense Refill  . calcium-vitamin D (OSCAL WITH D) 500-200 MG-UNIT per tablet Take 1 tablet by mouth daily.    . dorzolamide-timolol (COSOPT) 22.3-6.8 MG/ML ophthalmic solution Place 1 drop into both eyes 2 (two) times daily.     . fluticasone (FLONASE)  50 MCG/ACT nasal spray Place 2 sprays into the nose as needed for rhinitis.    . furosemide (LASIX) 20 MG tablet Take 20 mg by mouth as directed. If weight goes to 85 lb's or higher then increase lasix back to 20 mg BID    . Lactobacillus (FLORAJEN ACIDOPHILUS PO) Take 1 tablet by mouth daily.     . Vitamin D, Ergocalciferol, (DRISDOL) 50000 UNITS CAPS Take 50,000 Units by mouth every 14 (fourteen) days.     Marland Kitchen warfarin (COUMADIN) 5 MG tablet TAKE ONE-HALF (1/2) TABLET DAILY OR AS DIRECTED BY COUMADIN CLINIC 50 tablet 0  . zoledronic acid (RECLAST) 5 MG/100ML SOLN Inject 5 mg into the vein once. Once a year.     No current facility-administered medications for this visit.    Allergies:   Sulfa antibiotics and Risedronate sodium    Social History:  The patient  reports that she has never smoked. She has never used smokeless tobacco. She reports that she drinks alcohol.   Family History:  The patient's family history includes Cancer in her father and mother.    ROS:   Please see the history of present illness.   Review of Systems  Constitution: Positive for weight loss.  Cardiovascular: Positive for  dyspnea on exertion.  Hematologic/Lymphatic: Bruises/bleeds easily.  All other systems reviewed and are negative.     PHYSICAL EXAM: VS:  BP 140/50 mmHg  Pulse 66  Ht 4\' 11"  (1.499 m)  Wt 86 lb 12.8 oz (39.372 kg)  BMI 17.52 kg/m2    Wt Readings from Last 3 Encounters:  06/28/15 86 lb 12.8 oz (39.372 kg)  06/02/15 90 lb 1.9 oz (40.878 kg)  02/28/15 90 lb 2.7 oz (40.9 kg)     GEN: Well nourished, well developed, in no acute distress HEENT: normal Neck: no JVD,   no masses Cardiac:  Normal S1/S2, irreg irreg rhythm; no murmur, no rubs or gallops, no LE edema   Respiratory: Decreased breath sounds bilaterally, no wheezing, rhonchi or rales. GI: soft, nontender, nondistended, + BS MS: no deformity or atrophy Skin: warm and dry  Neuro:  CNs II-XII intact, Strength and sensation  are intact Psych: Normal affect   EKG:  EKG is not ordered today.  It demonstrates:   n/a   Recent Labs: 02/25/2015: ALT 17 02/26/2015: B Natriuretic Peptide 485.3* 02/27/2015: Hemoglobin 11.0*; Platelets 104* 06/11/2015: BUN 27*; Creat 0.81; Potassium 4.1; Sodium 133*    Lipid Panel No results found for: CHOL, TRIG, HDL, CHOLHDL, VLDL, LDLCALC, LDLDIRECT    ASSESSMENT AND PLAN:  1. Chronic Diastolic CHF:   Volume improved. She would like to change her Lasix back to QD. She can go ahead and reduce her Lasix back to QD.  But, if her weight reaches 85 lbs or higher, she should go back to 20 mg Twice daily.  She is meticulous about weighing daily.   2. Chronic AFib:  Rate controlled. She remains on Coumadin.     Medication Changes: Current medicines are reviewed at length with the patient today.  Concerns regarding medicines are as outlined above.  The following changes have been made:   Discontinued Medications   No medications on file   Modified Medications   No medications on file   New Prescriptions   No medications on file   Labs/ tests ordered today include:   No orders of the defined types were placed in this encounter.     Disposition:    FU Dr. Verdis PrimeHenry [name] 09/2015 as planned.     Signed, Brynda RimScott Ibrahem Volkman, PA-C, MHS 06/28/2015 2:42 PM    [facility] [address], [place], [place]  [address] Phone: [phone]; Fax: [phone]

## 2015-06-28 ENCOUNTER — Ambulatory Visit (INDEPENDENT_AMBULATORY_CARE_PROVIDER_SITE_OTHER): Payer: Medicare Other | Admitting: Physician Assistant

## 2015-06-28 ENCOUNTER — Encounter: Payer: Self-pay | Admitting: Physician Assistant

## 2015-06-28 VITALS — BP 140/50 | HR 66 | Ht 59.0 in | Wt 86.8 lb

## 2015-06-28 DIAGNOSIS — I482 Chronic atrial fibrillation, unspecified: Secondary | ICD-10-CM

## 2015-06-28 DIAGNOSIS — I5032 Chronic diastolic (congestive) heart failure: Secondary | ICD-10-CM

## 2015-06-28 NOTE — Patient Instructions (Addendum)
Medication Instructions:  DECREASE LASIX TO 20 MG DAILY; HOWEVER IF WEIGHT INCREASES TO 85 LB'S OR HIGHER THEN GO BACK ONTO LASIX 20 MG TWICE DAILY  Labwork: NONE  Testing/Procedures: NONE  Follow-Up: DR. Katrinka BlazingSMITH 09/08/2015  Any Other Special Instructions Will Be Listed Below (If Applicable).   If you need a refill on your cardiac medications before your next appointment, please call your pharmacy.

## 2015-06-29 ENCOUNTER — Other Ambulatory Visit: Payer: Self-pay | Admitting: Interventional Cardiology

## 2015-08-03 ENCOUNTER — Ambulatory Visit (INDEPENDENT_AMBULATORY_CARE_PROVIDER_SITE_OTHER): Payer: Medicare Other | Admitting: Pharmacist

## 2015-08-03 DIAGNOSIS — Z5181 Encounter for therapeutic drug level monitoring: Secondary | ICD-10-CM | POA: Diagnosis not present

## 2015-08-03 DIAGNOSIS — I4891 Unspecified atrial fibrillation: Secondary | ICD-10-CM

## 2015-08-03 LAB — POCT INR: INR: 2.3

## 2015-09-03 ENCOUNTER — Encounter: Payer: Self-pay | Admitting: Interventional Cardiology

## 2015-09-08 ENCOUNTER — Encounter: Payer: Self-pay | Admitting: Interventional Cardiology

## 2015-09-08 ENCOUNTER — Ambulatory Visit (INDEPENDENT_AMBULATORY_CARE_PROVIDER_SITE_OTHER): Payer: Medicare Other | Admitting: Interventional Cardiology

## 2015-09-08 VITALS — BP 134/58 | HR 62 | Ht <= 58 in | Wt 87.8 lb

## 2015-09-08 DIAGNOSIS — Z7901 Long term (current) use of anticoagulants: Secondary | ICD-10-CM

## 2015-09-08 DIAGNOSIS — I482 Chronic atrial fibrillation, unspecified: Secondary | ICD-10-CM

## 2015-09-08 DIAGNOSIS — I5032 Chronic diastolic (congestive) heart failure: Secondary | ICD-10-CM

## 2015-09-08 DIAGNOSIS — J90 Pleural effusion, not elsewhere classified: Secondary | ICD-10-CM

## 2015-09-08 DIAGNOSIS — J948 Other specified pleural conditions: Secondary | ICD-10-CM | POA: Diagnosis not present

## 2015-09-08 NOTE — Progress Notes (Signed)
Cardiology Office Note   Date:  09/08/2015   ID:  ZOLLIE CLEMENCE, DOB [birth year], MRN [id number]  PCP:  Ezequiel Kayser, MD  Cardiologist:  Lesleigh Noe, MD   Chief Complaint  Patient presents with  . Atrial Fibrillation      History of Present Illness: [name] is a 82 y.o. female who presents for chronic atrial fibrillation, chronic anticoagulation, history of right pleural effusion.  Patient is elderly and frail. Still lives independently. She is accompanied by daughter. No syncope or significant medical problems since last office visit. Appetite is improved. Overall endurance is improved.    Past Medical History  Diagnosis Date  . Osteoporosis   . Atrial Fibrillation and Flutter     a. chronic coumadin;  b. 10/2004 Echo: EF 55-65%, no rwma, mild AI, mild-mod TR.  . Glaucoma   . Osteoporosis   . Hard of hearing     bilateral hearing aids  . Pleural effusion, right     a. 05/2014  . Permanent atrial fibrillation [facility])     Past Surgical History  Procedure Laterality Date  . Fixation kyphoplasty      back  . Cholecystectomy    . Glaucoma surgery  09/17/12     Current Outpatient Prescriptions  Medication Sig Dispense Refill  . calcium-vitamin D (OSCAL WITH D) 500-200 MG-UNIT per tablet Take 1 tablet by mouth daily.    . dorzolamide-timolol (COSOPT) 22.3-6.8 MG/ML ophthalmic solution Place 1 drop into both eyes 2 (two) times daily.     . fluticasone (FLONASE) 50 MCG/ACT nasal spray Place 2 sprays into the nose as needed for rhinitis.    . furosemide (LASIX) 20 MG tablet Take 20 mg by mouth as directed. If weight goes to 85 lb's or higher then increase lasix back to 20 mg BID    . Vitamin D, Ergocalciferol, (DRISDOL) 50000 UNITS CAPS Take 50,000 Units by mouth every 14 (fourteen) days.     Marland Kitchen warfarin (COUMADIN) 5 MG tablet Take half (1/2) tablet (2.5 mg total) by mouth daily EXCEPT on Fridays.Take quarter (0.25) tablet (1.25 mg total) by mouth only on  FRIDAYS. Or as directed by coumadin clinic.    . zoledronic acid (RECLAST) 5 MG/100ML SOLN Inject 5 mg into the vein once. Once a year.     No current facility-administered medications for this visit.    Allergies:   Sulfa antibiotics and Risedronate sodium    Social History:  The patient  reports that she has never smoked. She has never used smokeless tobacco. She reports that she drinks alcohol.   Family History:  The patient's family history includes Cancer in her father and mother.    ROS:  Please see the history of present illness.   Otherwise, review of systems are positive for back pain, hearing loss, dyspnea on exertion, musculoskeletal discomfort..   All other systems are reviewed and negative.    PHYSICAL EXAM: VS:  BP 134/58 mmHg  Pulse 62  Ht  (1.473 m)  Wt 87 lb 12.8 oz (39.826 kg)  BMI 18.36 kg/m2 , BMI Body mass index is 18.36 kg/(m^2). GEN: Well nourished, well developed, in no acute distress. Elderly and frail HEENT: normal Neck: no JVD, carotid bruits, or masses Cardiac: IIRR.  There is no murmur, rub, or gallop. There is no edema. Respiratory:  clear to auscultation bilaterally, normal work of breathing. GI: soft, nontender, nondistended, + BS MS: no deformity or atrophy Skin: warm and dry,  no rash Neuro:  Strength and sensation are intact Psych: euthymic mood, full affect   EKG:  EKG is n0t ordered today.   Recent Labs: 02/25/2015: ALT 17 02/26/2015: B Natriuretic Peptide 485.3* 02/27/2015: Hemoglobin 11.0*; Platelets 104* 06/11/2015: BUN 27*; Creat 0.81; Potassium 4.1; Sodium 133*    Lipid Panel No results found for: CHOL, TRIG, HDL, CHOLHDL, VLDL, LDLCALC, LDLDIRECT    Wt Readings from Last 3 Encounters:  09/08/15 87 lb 12.8 oz (39.826 kg)  06/28/15 86 lb 12.8 oz (39.372 kg)  06/02/15 90 lb 1.9 oz (40.878 kg)      Other studies Reviewed: Additional studies/ records that were reviewed today include: Electronic health records were  reviewed. Recent office visits by Tereso Newcomer were noted.. The findings include records from recent summer admission with community-acquired pneumonia were reviewed. She was very sick and has significantly improved. She developed mild dehydration during this time..    ASSESSMENT AND PLAN:  1. Chronic atrial fibrillation (HCC) Rate control  2. Chronic diastolic heart failure (HCC) No clinical evidence of volume overload  3. Long term current use of anticoagulant therapy No bleeding complications  4. Pleural effusion, right Decreased breath sounds right base    Current medicines are reviewed at length with the patient today.  The patient has the following concerns regarding medicines: None.  The following changes/actions have been instituted:    Monitor for bleeding. Continue Coumadin clinic.  Incentive spirometry.  Labs/ tests ordered today include:  No orders of the defined types were placed in this encounter.     Disposition:   FU with HS in 10 months  Signed, Lesleigh Noe, MD  09/08/2015 3:44 PM    [facility] [address] [place], [place], [place]  [address] Phone: [phone]; Fax: [phone]

## 2015-09-08 NOTE — Patient Instructions (Signed)
Medication Instructions:  Your physician recommends that you continue on your current medications as directed. Please refer to the Current Medication list given to you today.   Labwork: None ordered  Testing/Procedures: None ordered  Follow-Up: Your physician wants you to follow-up in: 9 months with Dr.Smith You will receive a reminder letter in the mail two months in advance. If you don't receive a letter, please call our office to schedule the follow-up appointment.   Any Other Special Instructions Will Be Listed Below (If Applicable).     If you need a refill on your cardiac medications before your next appointment, please call your pharmacy.   

## 2015-09-14 ENCOUNTER — Ambulatory Visit (INDEPENDENT_AMBULATORY_CARE_PROVIDER_SITE_OTHER): Payer: Medicare Other

## 2015-09-14 DIAGNOSIS — I4891 Unspecified atrial fibrillation: Secondary | ICD-10-CM | POA: Diagnosis not present

## 2015-09-14 DIAGNOSIS — Z5181 Encounter for therapeutic drug level monitoring: Secondary | ICD-10-CM | POA: Diagnosis not present

## 2015-09-14 LAB — POCT INR: INR: 1.6

## 2015-09-25 ENCOUNTER — Other Ambulatory Visit: Payer: Self-pay | Admitting: Interventional Cardiology

## 2015-09-28 ENCOUNTER — Ambulatory Visit (INDEPENDENT_AMBULATORY_CARE_PROVIDER_SITE_OTHER): Payer: Medicare Other | Admitting: *Deleted

## 2015-09-28 DIAGNOSIS — I4891 Unspecified atrial fibrillation: Secondary | ICD-10-CM

## 2015-09-28 DIAGNOSIS — Z5181 Encounter for therapeutic drug level monitoring: Secondary | ICD-10-CM

## 2015-09-28 LAB — POCT INR: INR: 2.7

## 2015-10-12 ENCOUNTER — Ambulatory Visit (INDEPENDENT_AMBULATORY_CARE_PROVIDER_SITE_OTHER): Payer: Medicare Other | Admitting: *Deleted

## 2015-10-12 DIAGNOSIS — Z5181 Encounter for therapeutic drug level monitoring: Secondary | ICD-10-CM

## 2015-10-12 DIAGNOSIS — I4891 Unspecified atrial fibrillation: Secondary | ICD-10-CM | POA: Diagnosis not present

## 2015-10-12 LAB — POCT INR: INR: 2.1

## 2015-11-02 ENCOUNTER — Ambulatory Visit (INDEPENDENT_AMBULATORY_CARE_PROVIDER_SITE_OTHER): Payer: Medicare Other

## 2015-11-02 DIAGNOSIS — Z5181 Encounter for therapeutic drug level monitoring: Secondary | ICD-10-CM

## 2015-11-02 DIAGNOSIS — I4891 Unspecified atrial fibrillation: Secondary | ICD-10-CM | POA: Diagnosis not present

## 2015-11-02 LAB — POCT INR: INR: 4.1

## 2015-11-14 IMAGING — CR DG CHEST DECUBITUS*R*
1 series · 1 of 1 positions shown · non-contrast
Comparison: PA and lateral chest x-ray June 08, 2014.

CLINICAL DATA: Known right pleural effusion, assess for
free-flowing component

EXAM:
CHEST - RIGHT DECUBITUS

[w chest decub.]
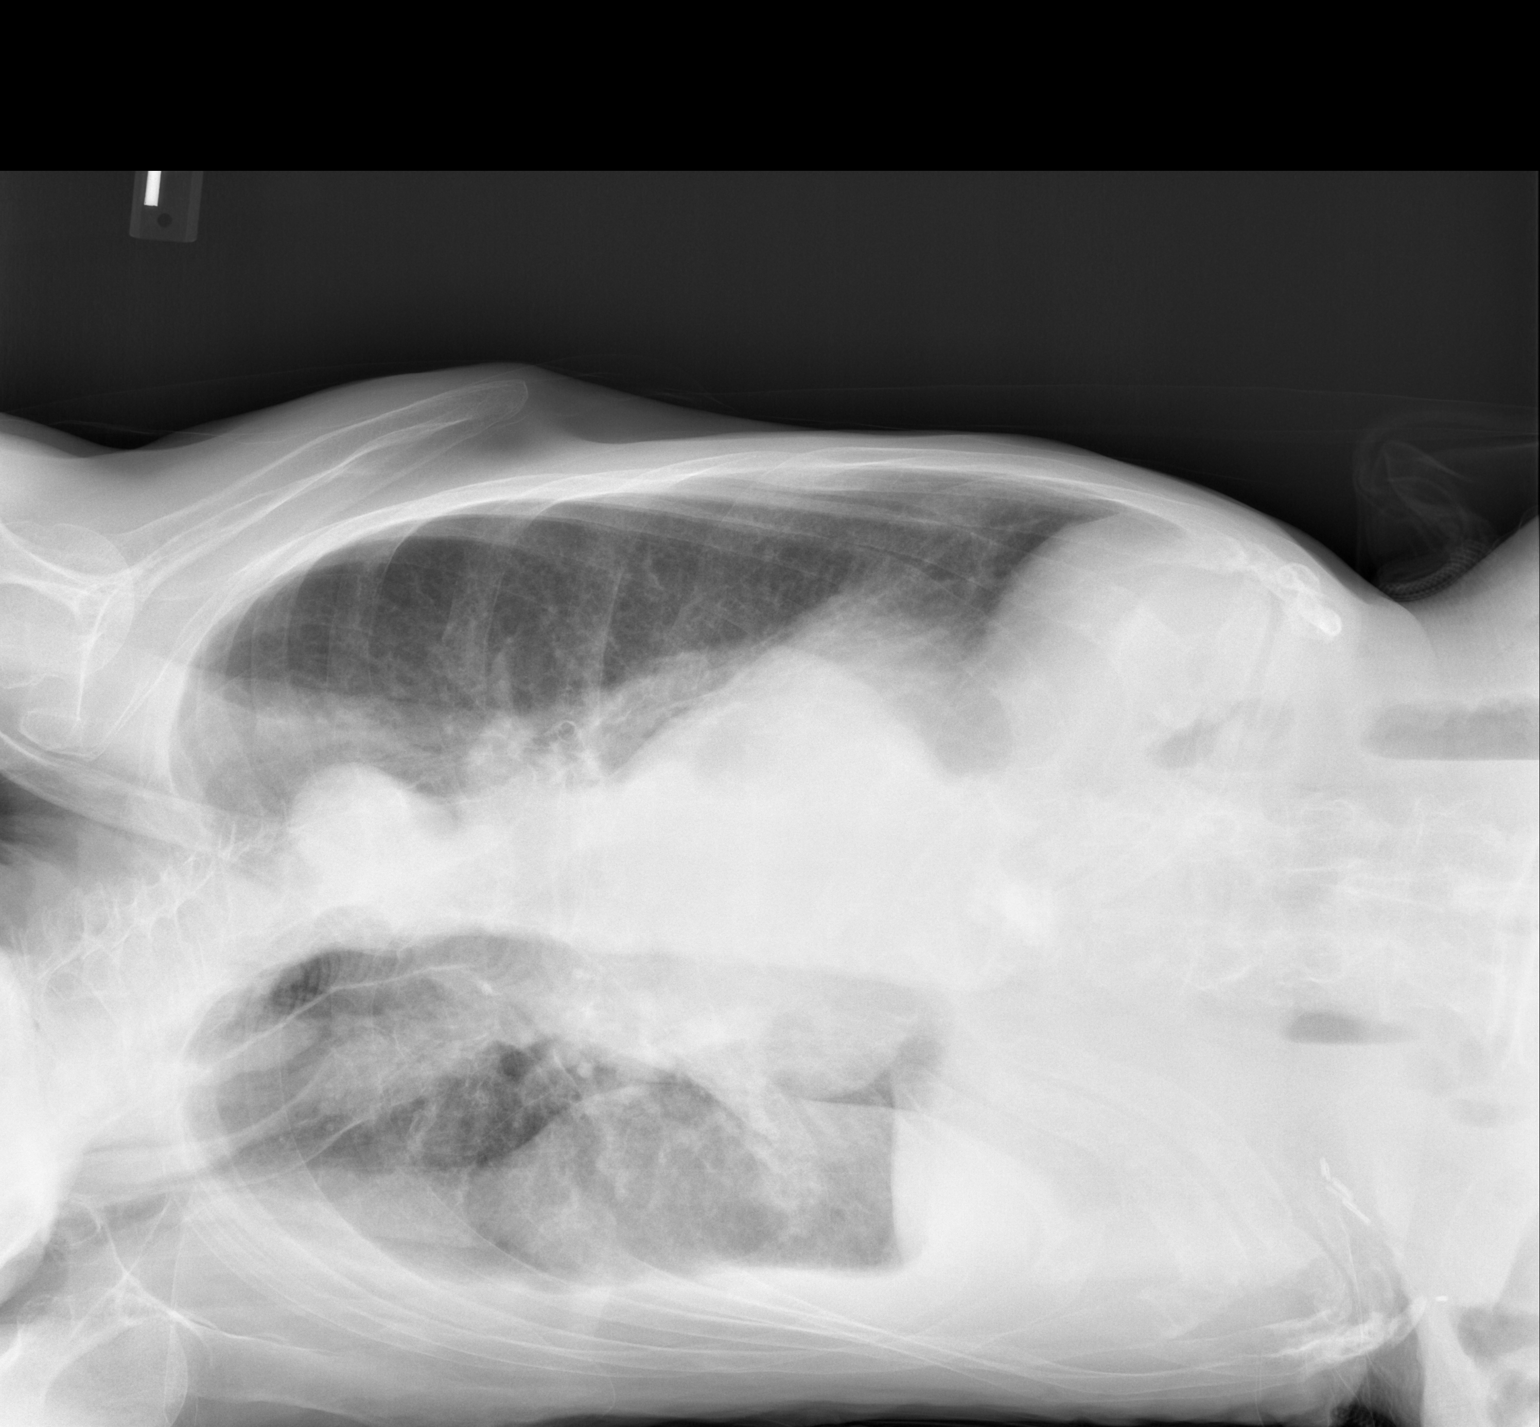

[1 of 1 positions shown; findings below may reference images not displayed]

FINDINGS: There is a free-flowing component of the right-sided pleural
effusion. The cardiac silhouette remains mildly enlarged. The
pulmonary vascularity is not engorged. No effusion on the left is
demonstrated.
IMPRESSION: There is a free-flowing component of the right-sided pleural
effusion.

## 2015-11-16 ENCOUNTER — Ambulatory Visit (INDEPENDENT_AMBULATORY_CARE_PROVIDER_SITE_OTHER): Payer: Medicare Other | Admitting: *Deleted

## 2015-11-16 DIAGNOSIS — I4891 Unspecified atrial fibrillation: Secondary | ICD-10-CM | POA: Diagnosis not present

## 2015-11-16 DIAGNOSIS — Z5181 Encounter for therapeutic drug level monitoring: Secondary | ICD-10-CM

## 2015-11-16 LAB — POCT INR: INR: 2.7

## 2015-12-07 ENCOUNTER — Ambulatory Visit (INDEPENDENT_AMBULATORY_CARE_PROVIDER_SITE_OTHER): Payer: Medicare Other

## 2015-12-07 DIAGNOSIS — Z5181 Encounter for therapeutic drug level monitoring: Secondary | ICD-10-CM

## 2015-12-07 DIAGNOSIS — I4891 Unspecified atrial fibrillation: Secondary | ICD-10-CM | POA: Diagnosis not present

## 2015-12-07 LAB — POCT INR: INR: 2

## 2015-12-28 ENCOUNTER — Ambulatory Visit (INDEPENDENT_AMBULATORY_CARE_PROVIDER_SITE_OTHER): Payer: Medicare Other | Admitting: *Deleted

## 2015-12-28 DIAGNOSIS — Z5181 Encounter for therapeutic drug level monitoring: Secondary | ICD-10-CM | POA: Diagnosis not present

## 2015-12-28 DIAGNOSIS — I4891 Unspecified atrial fibrillation: Secondary | ICD-10-CM

## 2015-12-28 LAB — POCT INR: INR: 2.3

## 2016-01-21 ENCOUNTER — Encounter (HOSPITAL_COMMUNITY): Payer: Medicare Other

## 2016-01-24 ENCOUNTER — Other Ambulatory Visit (HOSPITAL_COMMUNITY): Payer: Self-pay | Admitting: *Deleted

## 2016-01-25 ENCOUNTER — Ambulatory Visit (HOSPITAL_COMMUNITY)
Admission: RE | Admit: 2016-01-25 | Discharge: 2016-01-25 | Disposition: A | Discharge status: Home / Self Care | Payer: Medicare Other | Source: Ambulatory Visit | Attending: Internal Medicine | Admitting: Internal Medicine

## 2016-01-25 ENCOUNTER — Ambulatory Visit (INDEPENDENT_AMBULATORY_CARE_PROVIDER_SITE_OTHER): Payer: Medicare Other | Admitting: Pharmacist

## 2016-01-25 DIAGNOSIS — Z5181 Encounter for therapeutic drug level monitoring: Secondary | ICD-10-CM | POA: Diagnosis not present

## 2016-01-25 DIAGNOSIS — I4891 Unspecified atrial fibrillation: Secondary | ICD-10-CM | POA: Diagnosis not present

## 2016-01-25 DIAGNOSIS — M810 Age-related osteoporosis without current pathological fracture: Secondary | ICD-10-CM | POA: Insufficient documentation

## 2016-01-25 LAB — POCT INR: INR: 2.4

## 2016-01-25 MED ORDER — ZOLEDRONIC ACID 5 MG/100ML IV SOLN
INTRAVENOUS | Status: AC
  Filled 2016-01-25: qty 100

## 2016-01-25 MED ORDER — ZOLEDRONIC ACID 5 MG/100ML IV SOLN
5.0000 mg | Freq: Once | INTRAVENOUS | Status: AC
  Administered 2016-01-25: 5 mg via INTRAVENOUS

## 2016-02-21 ENCOUNTER — Telehealth: Payer: Self-pay | Admitting: Interventional Cardiology

## 2016-02-21 NOTE — Telephone Encounter (Signed)
Returned pt daughter Cindy's call. She reports tat the pt's weights and sob have been stable. Pt weighs daily. Pt denies any change in diet, she elevates her legs through the day. The pt has increased LE swelling x 1 week. She is currently taking Lasix 20mg  daily. She was previously instructed by Lilian ComaScott Weaver,PA to take Lasix 20mg  bid x 2-3 days when her weight is up. Herma ArdAdv Cindy that Dr.Smith is out of the office, I will talk with another provider in the office and call back with their recommendation. Arline AspCindy is agreeable with plan and verbalized understanding.

## 2016-02-21 NOTE — Telephone Encounter (Signed)
New message      Pt c/o swelling: STAT is pt has developed SOB within 24 hours  1. How long have you been experiencing swelling? Couple of days 2. Where is the swelling located?  Legs and ankles----little in her feet  3.  Are you currently taking a "fluid pill"? Yes----furosemide 20mg  4.  Are you currently SOB? Pt always has sob  5.  Have you traveled recently?  no Pt has not gained any weight

## 2016-02-21 NOTE — Telephone Encounter (Signed)
Pt daughter aware of Sunday SpillersLori Gerhardt,NP (flex) recommendation. Increase Lasix to 20mg  bid for 2-3 days. They can call at the end of the week to give an update on LE swelling. Call sooner  If sob worsens or weight gain. Cindy agreeable with plan and verbalized understanding

## 2016-02-25 ENCOUNTER — Telehealth: Payer: Self-pay | Admitting: Interventional Cardiology

## 2016-02-25 NOTE — Telephone Encounter (Signed)
New message     Pt c/o medication issue:  1. Name of Medication: lasix   2. How are you currently taking this medication (dosage and times per day)? Dose not provided  3. Are you having a reaction (difficulty breathing--STAT)? no  4. What is your medication issue? The pt was seen the other day and the dose was increased on the lasix(cause of the swelling) the daughter states the swelling has gone down

## 2016-02-28 ENCOUNTER — Telehealth: Payer: Self-pay | Admitting: Interventional Cardiology

## 2016-02-28 NOTE — Telephone Encounter (Signed)
New message     Patient daughter calling C/O weight gone up- 88.8 to 90.6  Down 90.4  a little bit should medication be increase before appt on  7.26

## 2016-02-28 NOTE — Telephone Encounter (Signed)
Returned pt daughter Cindy's call. Pt weight is up less than 1 lbs Adv her that would be considered stable. Pt LE swelling is stable. No complaints of sob. Pt has an appt with Jeanell Sparrow on 7/26 Adv that they keep that appt. Cindy voice appreciation for the call and verbalized understanding.

## 2016-02-28 NOTE — Progress Notes (Signed)
Cardiology Office Note    Date:  03/01/2016   ID:  [name], DOB 02-25-1920, MRN [id number]  PCP:  Sarah Kayser, MD  Cardiologist:  Dr. Katrinka [name]   CC:  LE edema and fatigue  History of Present Illness:  [name] is a 82 y.o. female with a history of chronic atrial fib/flutter on coumadin, history of right pleural effusion, mild-mod AR, mod MR/TR and chronic diastolic CHF who presents to clinic for evaluation of LE edema and fatigue.   [name] lives by herself and has done fairly well with this. [name] no longer drives - her daughter looks after her and drives for her.   [name] was seen in 06/2014 for mod R pleural effusion and was started on lasix 20mg  daily. 2D ECHO (06/08/14) at that time showed  Normal LV function, mild-mod AR, mod MR/TR, mild dilated RV and mod RV systolic dysfunction with mod pulm HTN (PA pressure 52).   [name] was last seen by Dr. Katrinka [name] in 09/2015 for cardiology follow up. [name] had improved appetite and general endurance at that time.   [name] [name] presents to clinic for evaluation of LE edema and fatigue. He has been eating at the [employer] rest[employer] lately with her daughter and maybe eating a little more salt than usual. Called the office last week and talked to Dr .[name] nurse, [name]. [name] reported increased LE edema and was told to take lasix 20mg  BID x3 days. This did help a little but weight still up, legs still swollen and a little SOB. [name] has noticed more labored breathing when coming back from the mailbox. [name] never lies flat at night so does not know if [name] has orthopnea. [name] does not sleep well in general and naps all day in arm chair while watching TV. [name] has also been wearing compression stockings lately. [name] has been feeling more fatigued than usual which is very unusal for her per her daughter. [name] weighs herself daily and weight is up at least 3 lbs (dry weight ~ 85 lbs). [name] @ 92 lbs.    Past Medical History:  Diagnosis Date  . Atrial  Fibrillation and Flutter    a. chronic coumadin;  b. 10/2004 Echo: EF 55-65%, no rwma, mild AI, mild-mod TR.  . Glaucoma   . Hard of hearing    bilateral hearing aids  . Osteoporosis   . Osteoporosis   . Permanent atrial fibrillation (HCC)   . Pleural effusion, right    a. 05/2014    Past Surgical History:  Procedure Laterality Date  . CHOLECYSTECTOMY    . FIXATION KYPHOPLASTY     back  . GLAUCOMA SURGERY  09/17/12    Current Medications: Outpatient Medications Prior to Visit  Medication Sig Dispense Refill  . calcium-vitamin D (OSCAL WITH D) 500-200 MG-UNIT per tablet Take 1 tablet by mouth daily.    . dorzolamide-timolol (COSOPT) 22.3-6.8 MG/ML ophthalmic solution Place 1 drop into both eyes 2 (two) times daily.     . fluticasone (FLONASE) 50 MCG/ACT nasal spray Place 2 sprays into the nose as needed for rhinitis.    . Vitamin D, Ergocalciferol, (DRISDOL) 50000 UNITS CAPS Take 50,000 Units by mouth every 14 (fourteen) days.     Marland Kitchen warfarin (COUMADIN) 5 MG tablet Take half (1/2) tablet (2.5 mg total) by mouth daily EXCEPT on Fridays.Take quarter (0.25) tablet (1.25 mg total) by mouth only on FRIDAYS. Or as directed by coumadin clinic.    Marland Kitchen warfarin (COUMADIN)  5 MG tablet TAKE AS DIRECTED BY COUMADIN CLINIC 60 tablet 2  . zoledronic acid (RECLAST) 5 MG/100ML SOLN Inject 5 mg into the vein once. Once a year.    . furosemide (LASIX) 20 MG tablet Take 20 mg by mouth as directed. If weight goes to 85 lb's or higher then increase lasix back to 20 mg BID     No facility-administered medications prior to visit.      Allergies:   Sulfa antibiotics and Risedronate sodium   Social History   Social History  . Marital status: Widowed    Spouse name: N/A  . Number of children: N/A  . Years of education: N/A   Social History Main Topics  . Smoking status: Never Smoker  . Smokeless tobacco: Never Used  . Alcohol use 0.0 oz/week     Comment: when I eat pizza, I have a half a beer  .  Drug use: Unknown  . Sexual activity: Not Asked   Other Topics Concern  . None   Social History Narrative  . None     Family History:  The patient's family history includes Cancer in her father and mother.     ROS:   Please see the history of present illness.    ROS All other systems reviewed and are negative.   PHYSICAL EXAM:   VS:  BP (!) 170/60   Pulse (!) 58   Ht 4\' 11"  (1.499 m)   Wt 92 lb 6.4 oz (41.9 kg)   BMI 18.66 kg/m    GEN: Well nourished, well developed, in no acute distress, appears younger than stated age  HEENT: normal  Neck: no JVD, carotid bruits, or masses Cardiac: irreg irreg; mild murmur, rubs, or gallops, 2+ LE edema, R>L Respiratory:  clear to auscultation bilaterally, normal work of breathing, dec breath sounds at basea GI: soft, nontender, nondistended, + BS MS: no deformity or atrophy  Skin: warm and dry, no rash Neuro:  Alert and Oriented x 3, Strength and sensation are intact Psych: euthymic mood, full affect  Wt Readings from Last 3 Encounters:  03/01/16 92 lb 6.4 oz (41.9 kg)  01/25/16 87 lb (39.5 kg)  09/08/15 87 lb 12.8 oz (39.8 kg)      Studies/Labs Reviewed:   EKG:  EKG is NOT ordered [name].    Recent Labs: 06/02/2015: Brain Natriuretic Peptide 349.6 06/11/2015: BUN 27; Creat 0.81; Potassium 4.1; Sodium 133   Lipid Panel No results found for: CHOL, TRIG, HDL, CHOLHDL, VLDL, LDLCALC, LDLDIRECT  Additional studies/ records that were reviewed [name] include:  2D ECHO: 06/08/2014 LV EF: 60% -  65% Study Conclusions - Left ventricle: The cavity size was normal. Wall thickness was normal. Systolic function was normal. The estimated ejection fraction was in the range of 60% to 65%. Wall motion was normal;there were no regional wall motion abnormalities. - Aortic valve: There was mild to moderate regurgitation. - Mitral valve: Prolapse, involving the posterior leaflet. There was moderate regurgitation. - Left atrium: The  atrium was severely dilated. - Right ventricle: The cavity size was mildly dilated. Systolic function was moderately reduced. - Right atrium: The atrium was mildly dilated. - Tricuspid valve: There was moderate regurgitation. - Pulmonary arteries: Systolic pressure was moderately increased.PA peak pressure: 52 mm Hg (S).   ASSESSMENT & PLAN:   Acute on chronic diastolic CHF: weight up 3-5 lbs. [name] feels her dry weight is ~85lbs. [name] [name] is 92 lbs by Sarah scale. Legs with 2+ LE edema,  R>L (no calf pain and on coumadin so low risk for DVT). Will restart her on Lasix  BID x 5 days then go back to  daily. Will check BNP, BMET [name] and recheck BMET Monday to monitor kidney function and electrolytes.    Hx of valvular abnormalities, RV dysfunction/ pulm HTN: mild-mod AR, mod MR/TR, mod pulm HTN on echo in 2015. Will update 2D ECHO   Hx of R pleural effusion: [name] has decreased breath sounds at right lung base, which has been noted before. [name] is mildly SOB. Will watch closely with diuresis.   Chronic atrial fib/flutter: well rate controlled on no AV nodal blocking agents. CHADSVASC score of at least 4 (CHF, age, f sex). Continue coumadin. Will have INR checked [name]  Fatigue: will check CBC and TSH  Spurious high BP: SBP 170 on RN check. ~128 upon my recheck.   Total time spent with patient was over 40 minutes which included evaluating patient, reviewing record and coordinating care. Face to face time >50%. Patient and daughter had a lot of questions and I spent over 40 minutes discussing diagnosis and plan.    Medication Adjustments/Labs and Tests Ordered: Current medicines are reviewed at length with the patient [name].  Concerns regarding medicines are outlined above.  Medication changes, Labs and Tests ordered [name] are listed in the Patient Instructions below. Patient Instructions  Medication Instructions:  Your physician has recommended you make the following change in your  medication:  1.  INCREASE the Lasix 20 mg taking 1 tablet twice a day for 5 days then go back to 1 tablet daily   Labwork: [name]:    BMET, BNP, CBC, & TSH Monday 03/06/16:  BMET  Testing/Procedures: Your physician has requested that you have an echocardiogram. Echocardiography is a painless test that uses sound waves to create images of your heart. It provides your doctor with information about the size and shape of your heart and how well your heart's chambers and valves are working. This procedure takes approximately one hour. There are no restrictions for this procedure.   Follow-Up: Your physician recommends that you schedule a follow-up appointment in: 3 MONTHS WITH DR. Katrinka [name]      Any Other Special Instructions Will Be Listed Below (If Applicable).  Echocardiogram An echocardiogram, or echocardiography, uses sound waves (ultrasound) to produce an image of your heart. The echocardiogram is simple, painless, obtained within a short period of time, and offers valuable information to your health care provider. The images from an echocardiogram can provide information such as:  Evidence of coronary artery disease (CAD).  Heart size.  Heart muscle function.  Heart valve function.  Aneurysm detection.  Evidence of a past heart attack.  Fluid buildup around the heart.  Heart muscle thickening.  Assess heart valve function. LET [facility] CARE PROVIDER KNOW ABOUT:  Any allergies you have.  All medicines you are taking, including vitamins, herbs, eye drops, creams, and over-the-counter medicines.  Previous problems you or members of your family have had with the use of anesthetics.  Any blood disorders you have.  Previous surgeries you have had.  Medical conditions you have.  Possibility of pregnancy, if this applies. BEFORE THE PROCEDURE  No special preparation is needed. Eat and drink normally.  PROCEDURE   In order to produce an image of your heart, gel will be  applied to your chest and a wand-like tool (transducer) will be moved over your chest. The gel will help transmit the sound waves from  the transducer. The sound waves will harmlessly bounce off your heart to allow the heart images to be captured in real-time motion. These images will then be recorded.  You may need an IV to receive a medicine that improves the quality of the pictures. AFTER THE PROCEDURE You may return to your normal schedule including diet, activities, and medicines, unless your health care provider tells you otherwise.   This information is not intended to replace advice given to you by your health care provider. Make sure you discuss any questions you have with your health care provider.   Document Released: 07/21/2000 Document Revised: 08/14/2014 Document Reviewed: 03/31/2013 Elsevier Interactive Patient Education [employer]   If you need a refill on your cardiac medications before your next appointment, please call your pharmacy.      Signed, Cline Crock, PA-C  03/01/2016 12:49 PM    [facility] [address] [place], [place], [place]  [address] Phone: [phone]; Fax: [phone]

## 2016-02-28 NOTE — Telephone Encounter (Signed)
Spoke with pt's daughter. Pt's LE swelling improved, and she has resumed her normal dosage of Lasix 20mg  daily.  Pt's daughter has complaints of her mother's sleeping schedule and energy level. Adv her that they f/u with pt's pcp regarding pt's irregular sleep patterns.  Pt doesn't seems to have an cardiac complaints. trhe daughter sts that she would still like her mother evaluated and would like to see an APP soon. She rqst for the pt to see Tereso Newcomer, PA-C adv her that Lorin Picket will not being seeing pt's this week. I will fwd a message to [facility] to call with a APP appt. Pt daughter verbalized understanding.

## 2016-03-01 ENCOUNTER — Encounter: Payer: Self-pay | Admitting: Physician Assistant

## 2016-03-01 ENCOUNTER — Ambulatory Visit (INDEPENDENT_AMBULATORY_CARE_PROVIDER_SITE_OTHER): Payer: Medicare Other | Admitting: Pharmacist

## 2016-03-01 ENCOUNTER — Encounter (INDEPENDENT_AMBULATORY_CARE_PROVIDER_SITE_OTHER): Payer: Self-pay

## 2016-03-01 ENCOUNTER — Ambulatory Visit (INDEPENDENT_AMBULATORY_CARE_PROVIDER_SITE_OTHER): Payer: Medicare Other | Admitting: Physician Assistant

## 2016-03-01 VITALS — BP 170/60 | HR 58 | Ht 59.0 in | Wt 92.4 lb

## 2016-03-01 DIAGNOSIS — R5383 Other fatigue: Secondary | ICD-10-CM | POA: Diagnosis not present

## 2016-03-01 DIAGNOSIS — I5033 Acute on chronic diastolic (congestive) heart failure: Secondary | ICD-10-CM | POA: Diagnosis not present

## 2016-03-01 DIAGNOSIS — I272 Other secondary pulmonary hypertension: Secondary | ICD-10-CM

## 2016-03-01 DIAGNOSIS — I351 Nonrheumatic aortic (valve) insufficiency: Secondary | ICD-10-CM

## 2016-03-01 DIAGNOSIS — I4891 Unspecified atrial fibrillation: Secondary | ICD-10-CM | POA: Diagnosis not present

## 2016-03-01 DIAGNOSIS — I482 Chronic atrial fibrillation, unspecified: Secondary | ICD-10-CM

## 2016-03-01 DIAGNOSIS — I5032 Chronic diastolic (congestive) heart failure: Secondary | ICD-10-CM | POA: Diagnosis not present

## 2016-03-01 DIAGNOSIS — Z5181 Encounter for therapeutic drug level monitoring: Secondary | ICD-10-CM

## 2016-03-01 LAB — CBC
HCT: 41 % (ref 35.0–45.0)
Hemoglobin: 13.3 g/dL (ref 11.7–15.5)
MCH: 29.3 pg (ref 27.0–33.0)
MCHC: 32.4 g/dL (ref 32.0–36.0)
MCV: 90.3 fL (ref 80.0–100.0)
MPV: 10.2 fL (ref 7.5–12.5)
PLATELETS: 157 10*3/uL (ref 140–400)
RBC: 4.54 MIL/uL (ref 3.80–5.10)
RDW: 14.2 % (ref 11.0–15.0)
WBC: 7.1 10*3/uL (ref 3.8–10.8)

## 2016-03-01 LAB — POCT INR: INR: 2.4

## 2016-03-01 MED ORDER — FUROSEMIDE 20 MG PO TABS: ORAL_TABLET | ORAL | 0 refills | Status: DC

## 2016-03-01 NOTE — Patient Instructions (Addendum)
Medication Instructions:  Your physician has recommended you make the following change in your medication:  1.  INCREASE the Lasix 20 mg taking 1 tablet twice a day for 5 days then go back to 1 tablet daily   Labwork: TODAY:    BMET, BNP, CBC, & TSH Monday 03/06/16:  BMET  Testing/Procedures: Your physician has requested that you have an echocardiogram. Echocardiography is a painless test that uses sound waves to create images of your heart. It provides your doctor with information about the size and shape of your heart and how well your heart's chambers and valves are working. This procedure takes approximately one hour. There are no restrictions for this procedure.   Follow-Up: Your physician recommends that you schedule a follow-up appointment in: 3 MONTHS WITH DR. Katrinka Blazing      Any Other Special Instructions Will Be Listed Below (If Applicable).  Echocardiogram An echocardiogram, or echocardiography, uses sound waves (ultrasound) to produce an image of your heart. The echocardiogram is simple, painless, obtained within a short period of time, and offers valuable information to your health care provider. The images from an echocardiogram can provide information such as:  Evidence of coronary artery disease (CAD).  Heart size.  Heart muscle function.  Heart valve function.  Aneurysm detection.  Evidence of a past heart attack.  Fluid buildup around the heart.  Heart muscle thickening.  Assess heart valve function. LET [facility] CARE PROVIDER KNOW ABOUT:  Any allergies you have.  All medicines you are taking, including vitamins, herbs, eye drops, creams, and over-the-counter medicines.  Previous problems you or members of your family have had with the use of anesthetics.  Any blood disorders you have.  Previous surgeries you have had.  Medical conditions you have.  Possibility of pregnancy, if this applies. BEFORE THE PROCEDURE  No special preparation is needed.  Eat and drink normally.  PROCEDURE   In order to produce an image of your heart, gel will be applied to your chest and a wand-like tool (transducer) will be moved over your chest. The gel will help transmit the sound waves from the transducer. The sound waves will harmlessly bounce off your heart to allow the heart images to be captured in real-time motion. These images will then be recorded.  You may need an IV to receive a medicine that improves the quality of the pictures. AFTER THE PROCEDURE You may return to your normal schedule including diet, activities, and medicines, unless your health care provider tells you otherwise.   This information is not intended to replace advice given to you by your health care provider. Make sure you discuss any questions you have with your health care provider.   Document Released: 07/21/2000 Document Revised: 08/14/2014 Document Reviewed: 03/31/2013 Elsevier Interactive Patient Education [employer]   If you need a refill on your cardiac medications before your next appointment, please call your pharmacy.

## 2016-03-02 LAB — BASIC METABOLIC PANEL
BUN: 20 mg/dL (ref 7–25)
CALCIUM: 9.6 mg/dL (ref 8.6–10.4)
CO2: 31 mmol/L (ref 20–31)
CREATININE: 0.59 mg/dL — AB (ref 0.60–0.88)
Chloride: 93 mmol/L — ABNORMAL LOW (ref 98–110)
Glucose, Bld: 87 mg/dL (ref 65–99)
Potassium: 4.4 mmol/L (ref 3.5–5.3)
Sodium: 133 mmol/L — ABNORMAL LOW (ref 135–146)

## 2016-03-02 LAB — BRAIN NATRIURETIC PEPTIDE: Brain Natriuretic Peptide: 468.9 pg/mL — ABNORMAL HIGH (ref <100)

## 2016-03-02 LAB — TSH: TSH: 2.53 mIU/L

## 2016-03-06 ENCOUNTER — Other Ambulatory Visit: Payer: Medicare Other | Admitting: *Deleted

## 2016-03-06 DIAGNOSIS — I5032 Chronic diastolic (congestive) heart failure: Secondary | ICD-10-CM

## 2016-03-06 LAB — BASIC METABOLIC PANEL
BUN: 22 mg/dL (ref 7–25)
CALCIUM: 9.7 mg/dL (ref 8.6–10.4)
CO2: 34 mmol/L — AB (ref 20–31)
CREATININE: 0.68 mg/dL (ref 0.60–0.88)
Chloride: 91 mmol/L — ABNORMAL LOW (ref 98–110)
Glucose, Bld: 83 mg/dL (ref 65–99)
Potassium: 5.1 mmol/L (ref 3.5–5.3)
Sodium: 131 mmol/L — ABNORMAL LOW (ref 135–146)

## 2016-03-13 ENCOUNTER — Telehealth: Payer: Self-pay | Admitting: Interventional Cardiology

## 2016-03-13 NOTE — Telephone Encounter (Signed)
New message    Patient daughter calling     Patient schedule for echo on tomorrow - patient C/O passing blood when having a bowel movement.

## 2016-03-13 NOTE — Telephone Encounter (Signed)
Spoke with pt daughter Arline AspCindy. Pt reports have a bowel movement over the weekend that resulted with a little blood in the toilet. Pt reporst being a little constipated and having to drink prune juice.Pt denies dark tarry stools, she sts that the blood was minimal, bust she wanted to make sure our office is aware. Adv pt daughter that her recent hemoglobin lab was normal. Pt was concerned this might affect her echo scheduled for 8/8. Assured pt that it would not.  Adv her to continue to monitor, she should call the office she her bowels become black tarry, or an increase in signs of bleeding. Pr and Arline AspCindy agreeable with plan and verbalized understanding.

## 2016-03-14 ENCOUNTER — Ambulatory Visit (HOSPITAL_COMMUNITY): Payer: Medicare Other | Attending: Cardiology

## 2016-03-14 ENCOUNTER — Other Ambulatory Visit: Payer: Self-pay

## 2016-03-14 DIAGNOSIS — I340 Nonrheumatic mitral (valve) insufficiency: Secondary | ICD-10-CM | POA: Diagnosis not present

## 2016-03-14 DIAGNOSIS — Q211 Atrial septal defect: Secondary | ICD-10-CM | POA: Diagnosis not present

## 2016-03-14 DIAGNOSIS — I517 Cardiomegaly: Secondary | ICD-10-CM | POA: Insufficient documentation

## 2016-03-14 DIAGNOSIS — I5032 Chronic diastolic (congestive) heart failure: Secondary | ICD-10-CM | POA: Diagnosis not present

## 2016-03-14 DIAGNOSIS — I313 Pericardial effusion (noninflammatory): Secondary | ICD-10-CM | POA: Insufficient documentation

## 2016-03-14 DIAGNOSIS — I351 Nonrheumatic aortic (valve) insufficiency: Secondary | ICD-10-CM | POA: Insufficient documentation

## 2016-03-14 DIAGNOSIS — I5031 Acute diastolic (congestive) heart failure: Secondary | ICD-10-CM | POA: Diagnosis present

## 2016-03-14 LAB — ECHOCARDIOGRAM COMPLETE
AOASC: 31 cm
AOVTI: 45.2 cm
AV Area mean vel: 1.41 cm2
AV VEL mean LVOT/AV: 0.5
AV area mean vel ind: 1.06 cm2/m2
AV vel: 1.32
AVAREAVTI: 1.33 cm2
AVAREAVTIIND: 0.99 cm2/m2
AVG: 6 mmHg
AVPG: 11 mmHg
AVPHT: 615 ms
AVPKVEL: 168 cm/s
Ao pk vel: 0.47 m/s
CHL CUP AV PEAK INDEX: 1
CHL CUP AV VALUE AREA INDEX: 0.99
CHL CUP MV DEC (S): 127
DOP CAL AO MEAN VELOCITY: 115 cm/s
EERAT: 12.87
EWDT: 127 ms
FS: 34 % (ref 28–44)
IVS/LV PW RATIO, ED: 0.67
LA ID, A-P, ES: 48 mm
LA diam end sys: 48 mm
LA diam index: 3.61 cm/m2
LA vol index: 68.4 mL/m2
LAVOL: 91 mL
LAVOLA4C: 100 mL
LV TDI E'LATERAL: 10.1
LV TDI E'MEDIAL: 4.12
LVEEAVG: 12.87
LVEEMED: 12.87
LVELAT: 10.1 cm/s
LVOT VTI: 21 cm
LVOT area: 2.84 cm2
LVOT peak VTI: 0.46 cm
LVOT peak vel: 78.7 cm/s
LVOTD: 19 mm
LVOTSV: 60 mL
Lateral S' vel: 9.47 cm/s
MV Peak grad: 7 mmHg
MV pk E vel: 130 m/s
PISA EROA: 0.22 cm2
PW: 11.6 mm — AB (ref 0.6–1.1)
RV sys press: 78 mmHg
Reg peak vel: 398 cm/s
TRMAXVEL: 398 cm/s
VTI: 213 cm
Valve area: 1.32 cm2

## 2016-03-14 NOTE — Telephone Encounter (Signed)
F/u message  Pt duaghter call requesting to speak with RN about previous conversation. Please call back to discuss

## 2016-03-15 NOTE — Telephone Encounter (Deleted)
Continued.....  Adv Cindy that I will fwd an update to Dr.Smith and the [facility]. Adv Cindy to keep us updated, we will await word from patients PCP/GI work up to determine when it is safe to resume Coumadin.  Cindy voiced appreciation for the call back and verbalized understanding. 

## 2016-03-15 NOTE — Telephone Encounter (Addendum)
Spoke with patients daughter Sarah AspCindy, she called to give an update ojn the patient. Patients had been having blood in her stool since the weekend. She was seen by her pcp Dr.Perini's office yesterday and was given a hemoccult test that resulted positive. Cbc was also drawn, patients hemoglobin dropped from 13 to 11 g/dL since July [id number]. Coumadin has been held, patient has been referred to GI for work up. Pt pcp will draw a repeat Cbc on 03/17/16.  [name] that I will fwd an update to Dr.Smith and the [facility]. [name] to keep us updated, we will await word from patients PCP/GI work up to determine when it is safe to resume Coumadin.  [name] voiced appreciation for the call back and verbalized understanding.  Update fwd to Dr.Smith and [facility]

## 2016-03-15 NOTE — Telephone Encounter (Deleted)
Continued.....  Herma ArdAdv Cindy that I will fwd an update to Dr.Smith and the [facility]. Herma ArdAdv Cindy to keep us updated, we will await word from patients PCP/GI work up to determine when it is safe to resume Coumadin.  Cindy voiced appreciation for the call back and verbalized understanding.

## 2016-03-15 NOTE — Telephone Encounter (Signed)
Spoke with pt's daughter advised her since pt is presently off Coumadin we will cancel her previously scheduled follow-up appt on 04/17/16.  Asked daughter to notify us when pt has been cleared to resume Coumadin and we will reschedule her a follow-up appt in the Coumadin clinic for approx 7-10 days after restart.  Pt's daughter verbalized understanding.  Asked daughter to give pt our best wishes and keep us updated on pt's status.

## 2016-03-15 NOTE — Telephone Encounter (Signed)
Follow up  Pts daughter would like nurse to call her back.  Dr. Waynard EdwardsPerini is pt off coumadin and would like to let someone know.  Please Follow up

## 2016-03-17 ENCOUNTER — Telehealth: Payer: Self-pay | Admitting: Gastroenterology

## 2016-03-17 NOTE — Telephone Encounter (Signed)
Fannie KneeSue NP asked that the pt be seen ASAP for heme positive stool, she is own coumadin for A fib but it is being held until she is seen by GI. She has never seen a GI before.  I consulted Victorino DikeJennifer and she stated it is appropriate to schedule with PA.  She was given an appt for 03/22/16 with Shanda BumpsJessica.  Records will be faxed to our office for the appt. Her Hgb in July was 13 and yesterday it was 11.1. Fannie KneeSue will notify the pt of the appt and will also get labs on Monday

## 2016-03-22 ENCOUNTER — Encounter: Payer: Self-pay | Admitting: Gastroenterology

## 2016-03-22 ENCOUNTER — Ambulatory Visit (INDEPENDENT_AMBULATORY_CARE_PROVIDER_SITE_OTHER): Payer: Medicare Other | Admitting: Gastroenterology

## 2016-03-22 ENCOUNTER — Telehealth: Payer: Self-pay | Admitting: Interventional Cardiology

## 2016-03-22 VITALS — BP 130/70 | HR 72

## 2016-03-22 DIAGNOSIS — Z7901 Long term (current) use of anticoagulants: Secondary | ICD-10-CM

## 2016-03-22 DIAGNOSIS — K625 Hemorrhage of anus and rectum: Secondary | ICD-10-CM | POA: Diagnosis not present

## 2016-03-22 NOTE — Telephone Encounter (Signed)
New message        Pt saw GI doctor today.  GI doctor want to restart coumadin this Friday.  Please call with dosage and next appt date

## 2016-03-22 NOTE — Progress Notes (Signed)
Consultation note reviewed. Agree with reasonable assessment and plans

## 2016-03-22 NOTE — Telephone Encounter (Signed)
Called spoke with pt's daughter she states given pt's age 82, and pt has not had any further episodes of bleeding they are going to hold off on colonoscopy.  Pt has been cleared to resume Coumadin by GI, will resume on Friday advised to take 1/2 tablet on Friday, then resume previous dosage 1/2 tablet daily except 1/4 tablet on Fridays.  Made appt for INR check on Tuesday 04/04/16 at 12:15pm to check INR after resuming Warfarin.  Pt's daughter will monitor for bleeding and call with any signs of bleeding after resuming.  Pt's daughter has a question about how long/often pt should be wearing her compression stockings.  Please call and advise.  Thanks you.

## 2016-03-22 NOTE — Progress Notes (Signed)
03/22/2016 [name] [id number] [birth year]   HISTORY OF PRESENT ILLNESS:  This is a pleasant 82 year old female who is new to our office and has been referred here by her PCP, Dr. Waynard EdwardsPerini, for evaluation of rectal bleeding and drop in her hemoglobin. She has atrial fibrillation and is on Coumadin, which she has been taking for the past several years. A couple of weeks ago she felt constipated and was straining and then following that she passed some blood in the toilet and then was seeing some on the toilet paper upon wiping. Her Coumadin has been discontinued as of 8 days ago. She has not seen any further blood for the past 4 days. She had colonoscopy many years ago.  Her hemoglobin 3 weeks ago was 13.3 g but it appears that she tends to run in the 11-12 g range according to labs over the past year or so. Just last week her hemoglobin was down to 11.1 g, but then repeat again on Monday was back up to 11.7 g, so I'm questioning how much this has actually dropped and if potentially the July hemoglobin may have been slightly high for her. She was Hemoccult-positive 3, however.  She is here today with her daughter.   Past Medical History:  Diagnosis Date  . Atrial Fibrillation and Flutter    a. chronic coumadin;  b. 10/2004 Echo: EF 55-65%, no rwma, mild AI, mild-mod TR.  Marland Kitchen. CHF (congestive heart failure) (HCC)   . Glaucoma   . Hard of hearing    bilateral hearing aids  . Osteoporosis   . Osteoporosis   . Permanent atrial fibrillation (HCC)   . Pleural effusion, right    a. 05/2014   Past Surgical History:  Procedure Laterality Date  . APPENDECTOMY    . CHOLECYSTECTOMY    . FIXATION KYPHOPLASTY     back  . GLAUCOMA SURGERY  09/17/12    reports that she has never smoked. She has never used smokeless tobacco. She reports that she does not drink alcohol or use drugs. family history includes Cancer in her father and mother. Allergies  Allergen Reactions  . Sulfa Antibiotics  Anaphylaxis  . Risedronate Sodium Other (See Comments)    headaches      Outpatient Encounter Prescriptions as of 03/22/2016  Medication Sig  . calcium-vitamin D (OSCAL WITH D) 500-200 MG-UNIT per tablet Take 1 tablet by mouth daily.  . dorzolamide-timolol (COSOPT) 22.3-6.8 MG/ML ophthalmic solution Place 1 drop into both eyes 2 (two) times daily.   . fluticasone (FLONASE) 50 MCG/ACT nasal spray Place 2 sprays into the nose as needed for rhinitis.  . furosemide (LASIX) 20 MG tablet Take 1 tablet by mouth twice a day until 03/05/16 then take 1 tablet by mouth daily  . Vitamin D, Ergocalciferol, (DRISDOL) 50000 UNITS CAPS Take 50,000 Units by mouth every 14 (fourteen) days.   . zoledronic acid (RECLAST) 5 MG/100ML SOLN Inject 5 mg into the vein once. Once a year.  . warfarin (COUMADIN) 5 MG tablet Take half (1/2) tablet (2.5 mg total) by mouth daily EXCEPT on Fridays.Take quarter (0.25) tablet (1.25 mg total) by mouth only on FRIDAYS. Or as directed by coumadin clinic.  Marland Kitchen. warfarin (COUMADIN) 5 MG tablet TAKE AS DIRECTED BY COUMADIN CLINIC (Patient not taking: Reported on 03/22/2016)   No facility-administered encounter medications on file as of 03/22/2016.      REVIEW OF SYSTEMS  : All other systems reviewed and negative except where noted  in the History of Present Illness.   PHYSICAL EXAM: BP 130/70 (BP Location: Left Arm, Patient Position: Sitting, Cuff Size: Normal)   Pulse 72  General: Frail white female in no acute distress Head: Normocephalic and atraumatic Eyes:  Sclerae anicteric, conjunctiva pink. Ears: Normal auditory acuity Lungs: Clear throughout to auscultation Heart: Regular rate and rhythm Abdomen: Soft, non-distended.  Normal bowel sounds.  Non-tender. Rectal:  Small hemorrhoids noted.  DRE did not reveal any masses and she was heme negative today.  Anoscopy revealed small internal hemorrhoids but no stigmata of recent bleeding. Musculoskeletal: Symmetrical with no gross  deformities  Skin: No lesions on visible extremities Extremities: No edema  Neurological: Alert oriented x 4, grossly non-focal Psychological:  Alert and cooperative. Normal mood and affect  ASSESSMENT AND PLAN: -82 year old female on Coumadin for atrial fibrillation who had recent rectal bleeding. Hgb ? down slightly since July.  Her Coumadin has now been on hold for the past 8 days and she has not had any further bleeding for the past 4 days. I had long discussion with her and her daughter regarding evaluation with colonoscopy versus restarting her Coumadin and observing with monitoring of her hemoglobin.  They have elected to defer procedure for now. She is going to restart her Coumadin on Friday at the direction of Dr. Katrinka BlazingSmith her cardiologist. She will monitor for any further bleeding and notify her PCP, cardiologist, and our office if that occurs. She will come back for a repeat hemoglobin in 2 weeks. They are aware that we do not know the exact source of her bleeding at this point and that the only way to know that for sure would be to proceed with colonoscopy. Once again they would like to proceed with observation for now.   CC:  Rodrigo RanPerini, Mark, MD

## 2016-03-22 NOTE — Patient Instructions (Signed)
Please come to our lab on 04-03-2016 to have blood drawn.  We will call you with the results.

## 2016-03-22 NOTE — Telephone Encounter (Signed)
Spoke with Arline AspCindy pt daughter. Adv her that the pt will get the most benefit from the compression stocking if she were to wear them daily. she should take them off prior to bed time. She should also elevate her legs while sitting as much as she can for her chronic LE swelling. Cindy verbalized and voiced appreciation for the call back.

## 2016-03-24 ENCOUNTER — Other Ambulatory Visit: Payer: Self-pay

## 2016-03-29 ENCOUNTER — Telehealth: Payer: Self-pay | Admitting: Interventional Cardiology

## 2016-03-29 DIAGNOSIS — I5032 Chronic diastolic (congestive) heart failure: Secondary | ICD-10-CM

## 2016-03-29 NOTE — Telephone Encounter (Signed)
New Message  Pt daughter call requesting to speak with RN. Pt daughter states pt has edema which is causing tightness in her legs. Pt daughter states pt had been drink water and taking lasix.Marland Kitchen. pts daughter states pt current weight is  92 lbs. Please call back to discuss

## 2016-03-29 NOTE — Telephone Encounter (Signed)
Returned pt daughter Cindy's call. Cindy sts that the pt is up 3lb in one week. Pt baseline wight is around 89lbs. Pt's weight this morning at home is 92lbs. Pt sob is stable, denies orthopnea. Pt does have increased LE swelling. Pt legs are tight, denies pain or weeping. Pt is taking Lasix 20mg  daily. Denies increased sodium intake,and typically only drinks 16-24oz daily. Pt does wear moderate 15-20 gauge compression stocking daily.  Herma ArdAdv Cindy that Dr.Smith is out of the office the rest of the week. I will talk with another provider in the office and call back with their recommendation. Cindy verbalized understanding.

## 2016-03-29 NOTE — Telephone Encounter (Signed)
Pt daughter Arline AspCindy aware Dr.Nishan ([facility]) recommendations. Increase Lasix to 20mg  bid for 3 days. Pt will need a bmet and bnp early next week. Lab appt scheduled for the same day as pt coumadin appt 8/29. Pt should continue to weight daily, limit sodium, wear compression stockings daily. Cindy verbalized understanding.  FYI fwd to Dr.Smith

## 2016-03-29 NOTE — Telephone Encounter (Signed)
Follow Up:; ° ° °Returning your call. °

## 2016-03-29 NOTE — Telephone Encounter (Signed)
Left pt's daughter  Salem SenateCindy Woods a message to call back.

## 2016-04-03 ENCOUNTER — Other Ambulatory Visit (INDEPENDENT_AMBULATORY_CARE_PROVIDER_SITE_OTHER): Payer: Medicare Other

## 2016-04-03 DIAGNOSIS — K625 Hemorrhage of anus and rectum: Secondary | ICD-10-CM | POA: Diagnosis not present

## 2016-04-03 LAB — CBC WITH DIFFERENTIAL/PLATELET
BASOS ABS: 0 10*3/uL (ref 0.0–0.1)
BASOS PCT: 0.5 % (ref 0.0–3.0)
EOS ABS: 0 10*3/uL (ref 0.0–0.7)
Eosinophils Relative: 0.5 % (ref 0.0–5.0)
HEMATOCRIT: 35.2 % — AB (ref 36.0–46.0)
HEMOGLOBIN: 11.6 g/dL — AB (ref 12.0–15.0)
LYMPHS PCT: 11.7 % — AB (ref 12.0–46.0)
Lymphs Abs: 0.6 10*3/uL — ABNORMAL LOW (ref 0.7–4.0)
MCHC: 33 g/dL (ref 30.0–36.0)
MCV: 89.7 fl (ref 78.0–100.0)
MONOS PCT: 8.1 % (ref 3.0–12.0)
Monocytes Absolute: 0.4 10*3/uL (ref 0.1–1.0)
Neutro Abs: 3.9 10*3/uL (ref 1.4–7.7)
Neutrophils Relative %: 79.2 % — ABNORMAL HIGH (ref 43.0–77.0)
Platelets: 205 10*3/uL (ref 150.0–400.0)
RBC: 3.92 Mil/uL (ref 3.87–5.11)
RDW: 15.9 % — AB (ref 11.5–15.5)
WBC: 4.9 10*3/uL (ref 4.0–10.5)

## 2016-04-04 ENCOUNTER — Ambulatory Visit (INDEPENDENT_AMBULATORY_CARE_PROVIDER_SITE_OTHER): Payer: Medicare Other

## 2016-04-04 ENCOUNTER — Other Ambulatory Visit: Payer: Medicare Other | Admitting: *Deleted

## 2016-04-04 ENCOUNTER — Telehealth: Payer: Self-pay | Admitting: *Deleted

## 2016-04-04 DIAGNOSIS — I4891 Unspecified atrial fibrillation: Secondary | ICD-10-CM

## 2016-04-04 DIAGNOSIS — I5032 Chronic diastolic (congestive) heart failure: Secondary | ICD-10-CM

## 2016-04-04 DIAGNOSIS — Z5181 Encounter for therapeutic drug level monitoring: Secondary | ICD-10-CM

## 2016-04-04 LAB — BASIC METABOLIC PANEL
BUN: 25 mg/dL (ref 7–25)
CALCIUM: 9.2 mg/dL (ref 8.6–10.4)
CO2: 32 mmol/L — AB (ref 20–31)
Chloride: 97 mmol/L — ABNORMAL LOW (ref 98–110)
Creat: 0.72 mg/dL (ref 0.60–0.88)
GLUCOSE: 94 mg/dL (ref 65–99)
Potassium: 4.5 mmol/L (ref 3.5–5.3)
SODIUM: 137 mmol/L (ref 135–146)

## 2016-04-04 LAB — POCT INR: INR: 2.8

## 2016-04-04 LAB — BRAIN NATRIURETIC PEPTIDE: BRAIN NATRIURETIC PEPTIDE: 649.1 pg/mL — AB (ref <100)

## 2016-04-04 MED ORDER — FUROSEMIDE 40 MG PO TABS: 40.0000 mg | ORAL_TABLET | Freq: Every day | ORAL | 3 refills | Status: DC

## 2016-04-04 NOTE — Telephone Encounter (Signed)
Notes Recorded by Lyn RecordsHenry W Smith, MD on 04/04/2016 at 4:50 PM EDT Increase furosemide to 40 mg daily. In 7-10 days repeat basic metabolic panel. He will be okay to give 40 mg tablets or she can continue to use 20 mg tablets taking 2 once each day.

## 2016-04-12 ENCOUNTER — Other Ambulatory Visit: Payer: Medicare Other | Admitting: *Deleted

## 2016-04-12 DIAGNOSIS — I5032 Chronic diastolic (congestive) heart failure: Secondary | ICD-10-CM

## 2016-04-12 DIAGNOSIS — J1000 Influenza due to other identified influenza virus with unspecified type of pneumonia: Secondary | ICD-10-CM

## 2016-04-12 LAB — CBC WITH DIFFERENTIAL/PLATELET
BASOS ABS: 58 {cells}/uL (ref 0–200)
Basophils Relative: 1 %
Eosinophils Absolute: 58 cells/uL (ref 15–500)
Eosinophils Relative: 1 %
HEMATOCRIT: 35.8 % (ref 35.0–45.0)
Hemoglobin: 11.4 g/dL — ABNORMAL LOW (ref 11.7–15.5)
LYMPHS PCT: 14 %
Lymphs Abs: 812 cells/uL — ABNORMAL LOW (ref 850–3900)
MCH: 27.9 pg (ref 27.0–33.0)
MCHC: 31.8 g/dL — AB (ref 32.0–36.0)
MCV: 87.5 fL (ref 80.0–100.0)
MONO ABS: 580 {cells}/uL (ref 200–950)
MPV: 10.1 fL (ref 7.5–12.5)
Monocytes Relative: 10 %
NEUTROS PCT: 74 %
Neutro Abs: 4292 cells/uL (ref 1500–7800)
Platelets: 228 10*3/uL (ref 140–400)
RBC: 4.09 MIL/uL (ref 3.80–5.10)
RDW: 14.4 % (ref 11.0–15.0)
WBC: 5.8 10*3/uL (ref 3.8–10.8)

## 2016-04-12 LAB — BASIC METABOLIC PANEL
BUN: 19 mg/dL (ref 7–25)
CO2: 32 mmol/L — AB (ref 20–31)
Calcium: 9.1 mg/dL (ref 8.6–10.4)
Chloride: 95 mmol/L — ABNORMAL LOW (ref 98–110)
Creat: 0.68 mg/dL (ref 0.60–0.88)
GLUCOSE: 99 mg/dL (ref 65–99)
POTASSIUM: 4.3 mmol/L (ref 3.5–5.3)
Sodium: 136 mmol/L (ref 135–146)

## 2016-04-25 ENCOUNTER — Ambulatory Visit (INDEPENDENT_AMBULATORY_CARE_PROVIDER_SITE_OTHER): Payer: Medicare Other | Admitting: *Deleted

## 2016-04-25 DIAGNOSIS — Z5181 Encounter for therapeutic drug level monitoring: Secondary | ICD-10-CM | POA: Diagnosis not present

## 2016-04-25 DIAGNOSIS — I4891 Unspecified atrial fibrillation: Secondary | ICD-10-CM

## 2016-04-25 LAB — POCT INR: INR: 3.7

## 2016-04-27 ENCOUNTER — Telehealth: Payer: Self-pay | Admitting: Interventional Cardiology

## 2016-04-27 DIAGNOSIS — I5032 Chronic diastolic (congestive) heart failure: Secondary | ICD-10-CM

## 2016-04-27 NOTE — Telephone Encounter (Signed)
New Message:     Her weight is down to 87.4,her Lasix was increased. She said that you wanted to be updated on her weight. Please call her when you have time.

## 2016-05-01 MED ORDER — FUROSEMIDE 20 MG PO TABS: 20.0000 mg | ORAL_TABLET | Freq: Every day | ORAL | Status: AC

## 2016-05-01 NOTE — Telephone Encounter (Signed)
Returned pt daughter Cindy's call. Arline AspCindy sts that the pt weight have been stable 88-89lbs. Her LE has improved, but there is still some tightness in her legs. Pt Lasix was increased to 40mg  daily on 8/29. Pt bmet on 9/6 was stable. Cindy sts that the pt sob had improved on the higher dose of Lasix, but has recently declined to where it was. Arline AspCindy sts that the pt has been having increased fatigue, and falls asleep when sitting, Arline AspCindy has stated this before and it has been an ongoing concern. There is some  concern that the pt has not been drinking a lot and the fatigue good be a component of dehydration.Herma Ardadv Cindy that Dr.Smith is out of the office. I will discuss with another provider in the office and call back with their recommendation. Cindy agreeable with plan and verbalized understanding.   Arline AspCindy is aware of Dr.Nelson ([facility]) recommendations. Pt should reduce Lasix back to 20mg  daily for 1 week.  they should give an update next week. Arline AspCindy is agreeable with plan and verbalized understanding.

## 2016-05-01 NOTE — Telephone Encounter (Signed)
Follow up   Pt daughter verbalized that the increased lasix is not helping she is very weak  And she wanted a follow up form the last call

## 2016-05-15 ENCOUNTER — Ambulatory Visit: Payer: Self-pay | Admitting: *Deleted

## 2016-05-15 DIAGNOSIS — Z5181 Encounter for therapeutic drug level monitoring: Secondary | ICD-10-CM

## 2016-06-07 DEATH — deceased

## 2016-06-22 ENCOUNTER — Ambulatory Visit: Payer: Medicare Other | Admitting: Interventional Cardiology

## 2016-08-02 IMAGING — CR DG CHEST 2V
2 series · 2 of 2 positions shown · non-contrast
Comparison: 06/08/2014.

CLINICAL DATA: Sudden onset nausea and vomiting.  Syncope.

EXAM:
CHEST  2 VIEW

[chest pa]
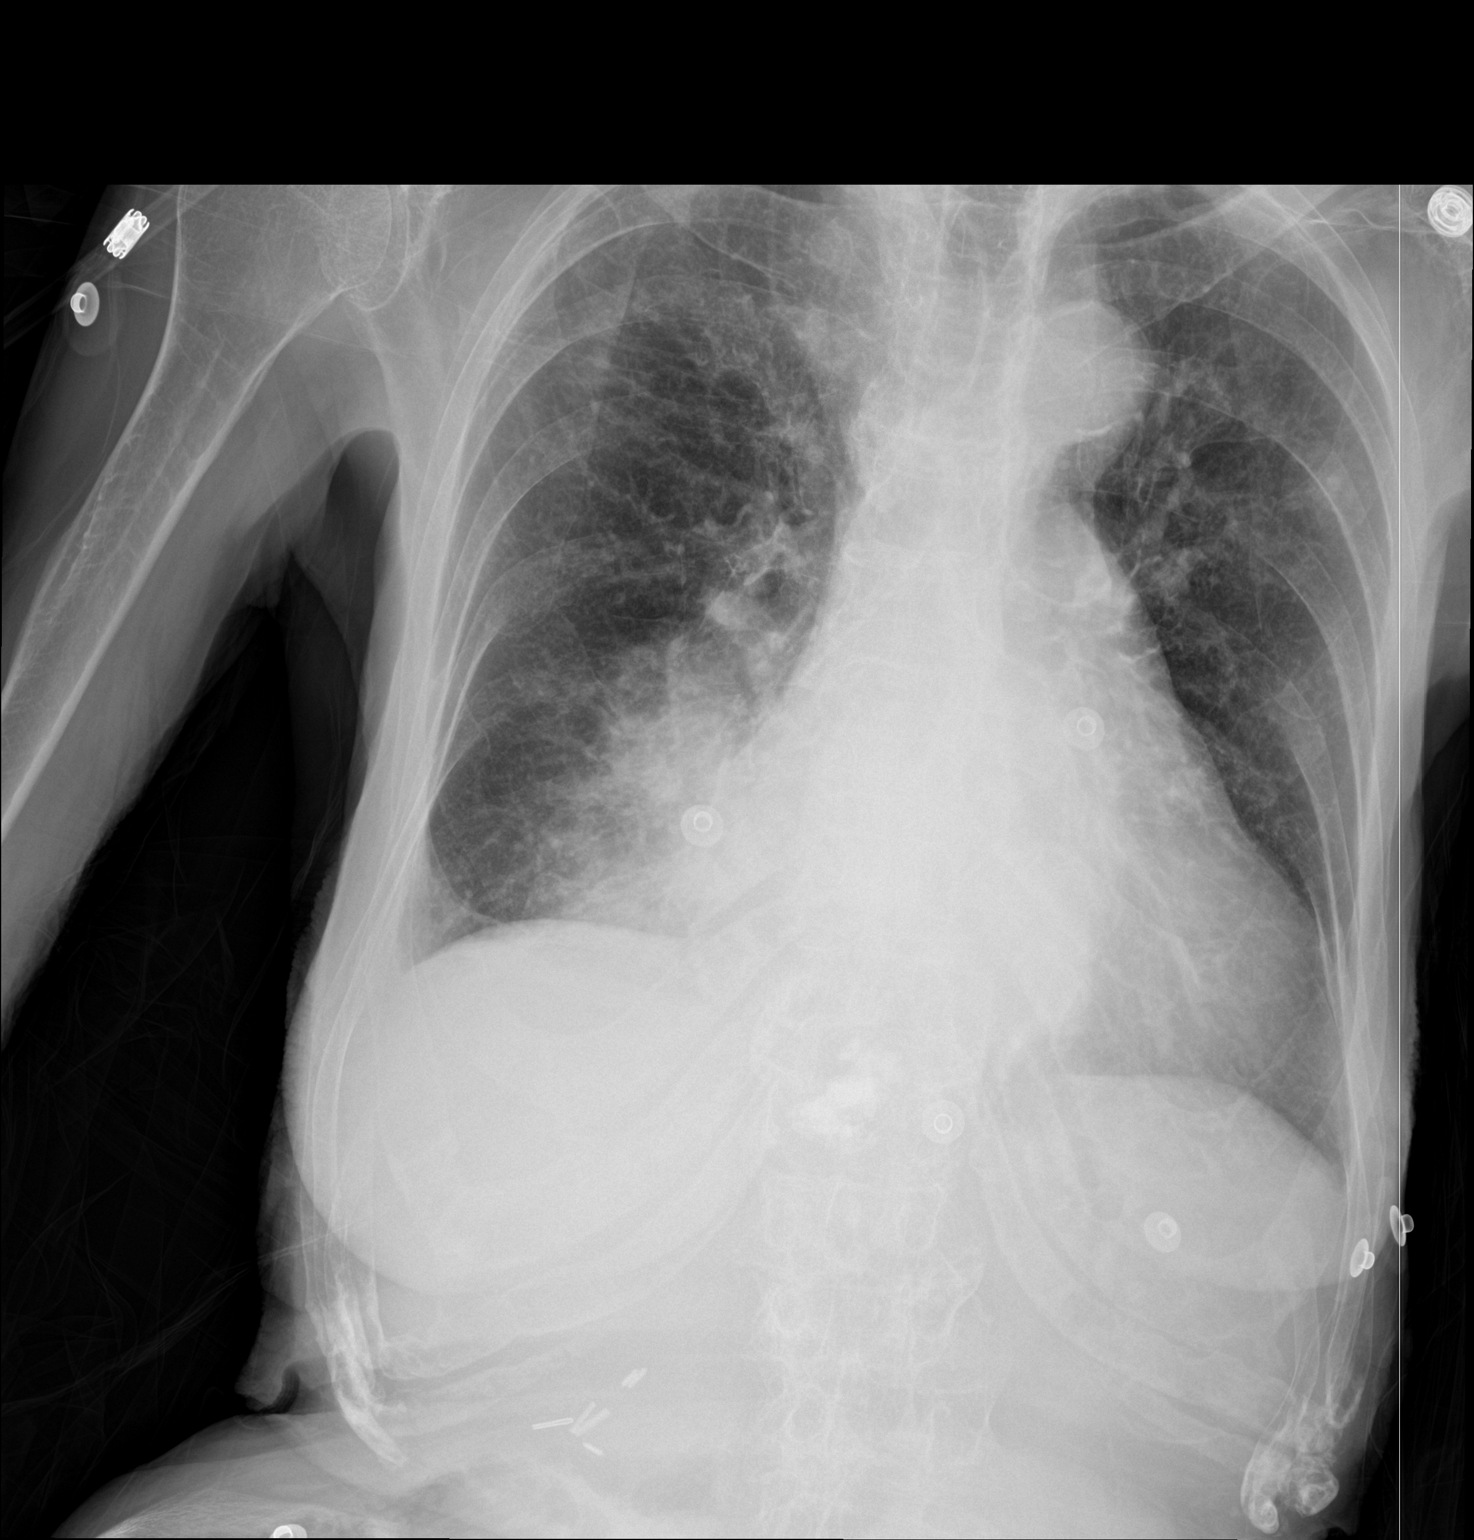

[chest lat]
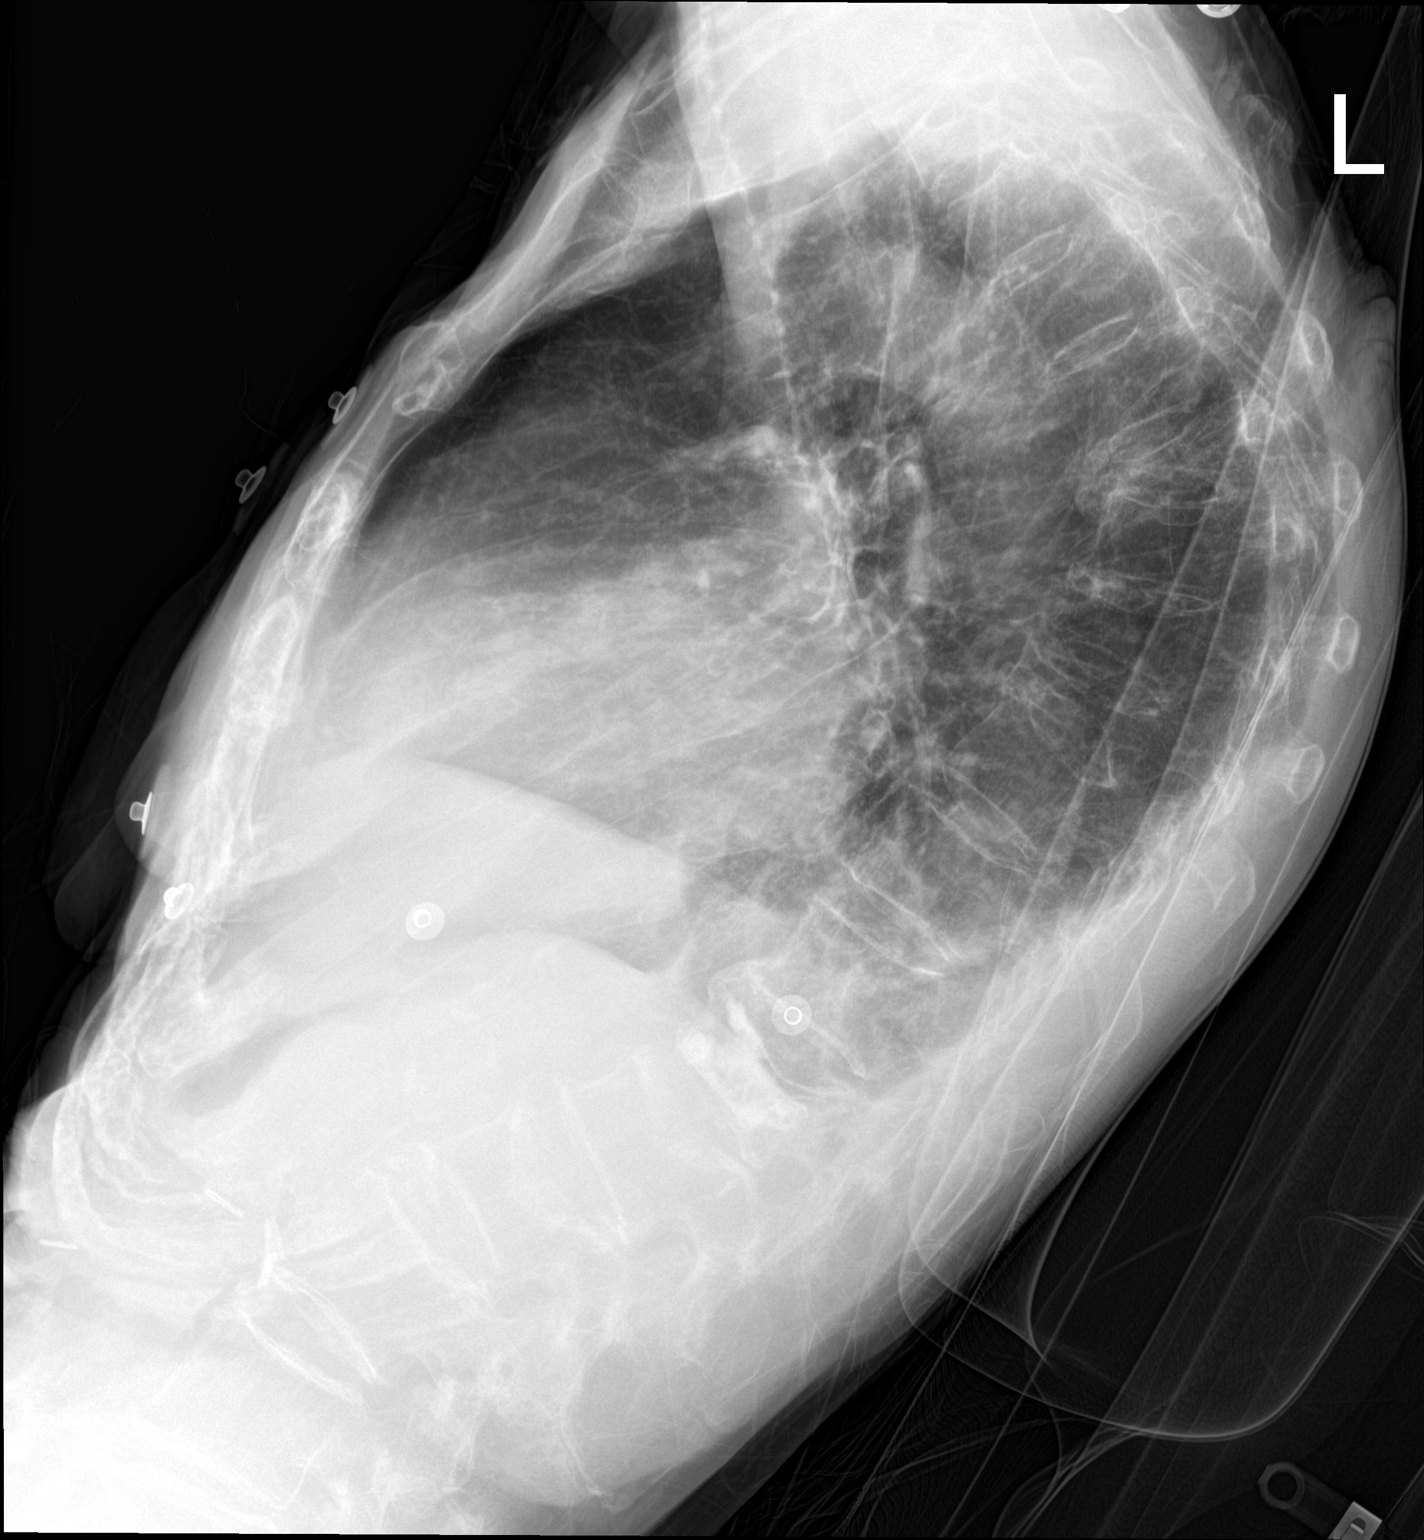

[2 of 2 positions shown; findings below may reference images not displayed]

FINDINGS: Interval patchy opacity in the right middle lobe and right lower
lobe, medially. Small bilateral pleural effusions, improved on the
right. Stable enlarged cardiac silhouette, hyperexpanded lungs and
mildly prominent interstitial markings. Stable thoracic spine
compression deformities of acute kyphosis. Diffuse osteopenia.
Cholecystectomy clips.
IMPRESSION: 1. Right lower lobe and right middle lobe pneumonia. No underlying
mass cannot be excluded.
2. Small right pleural effusion.
3. Stable cardiomegaly and COPD.
4. Stable multiple thoracic vertebral compression deformities and
acute kyphosis.

## 2016-08-04 IMAGING — DX DG CHEST 2V
2 series · 2 of 2 positions shown · non-contrast
Comparison: 02/25/2015

CLINICAL DATA: Shortness of breath and recent syncopal episode

EXAM:
CHEST - 2 VIEW

[w chest lat]
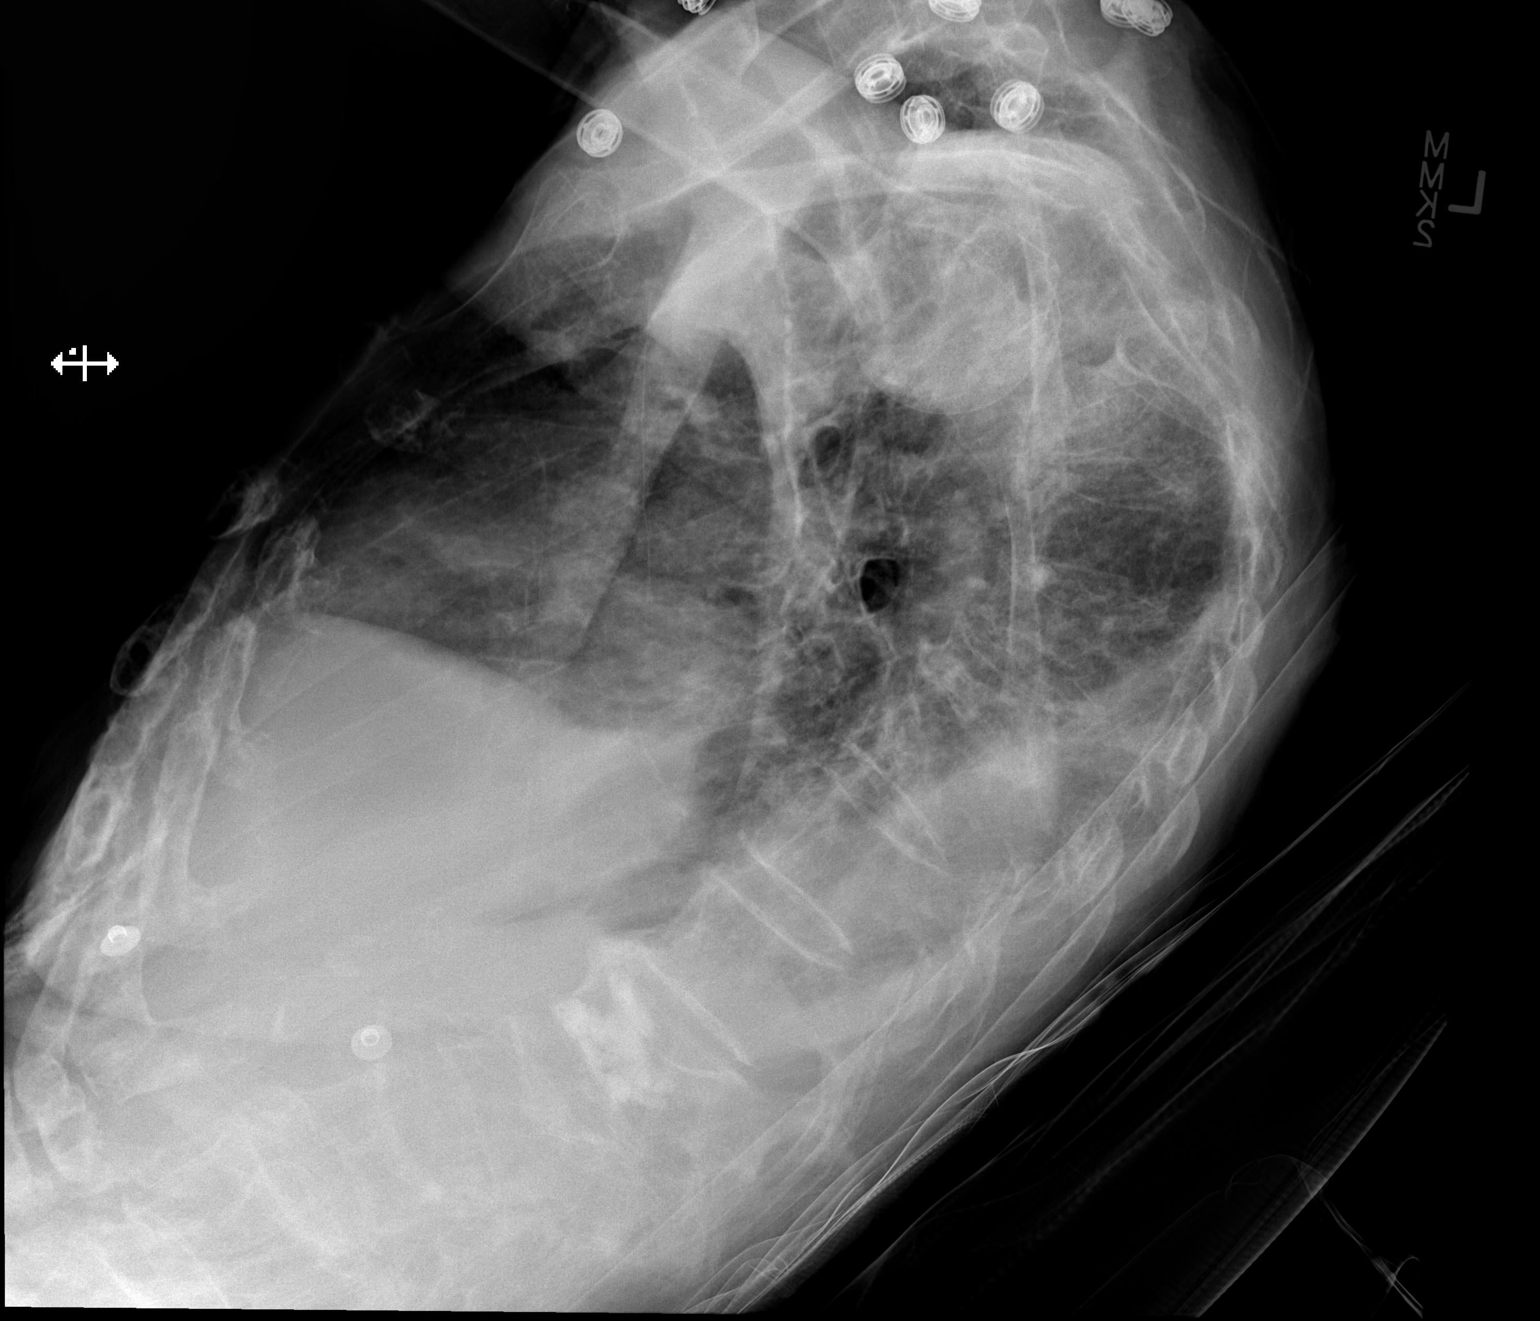

[x chest ap]
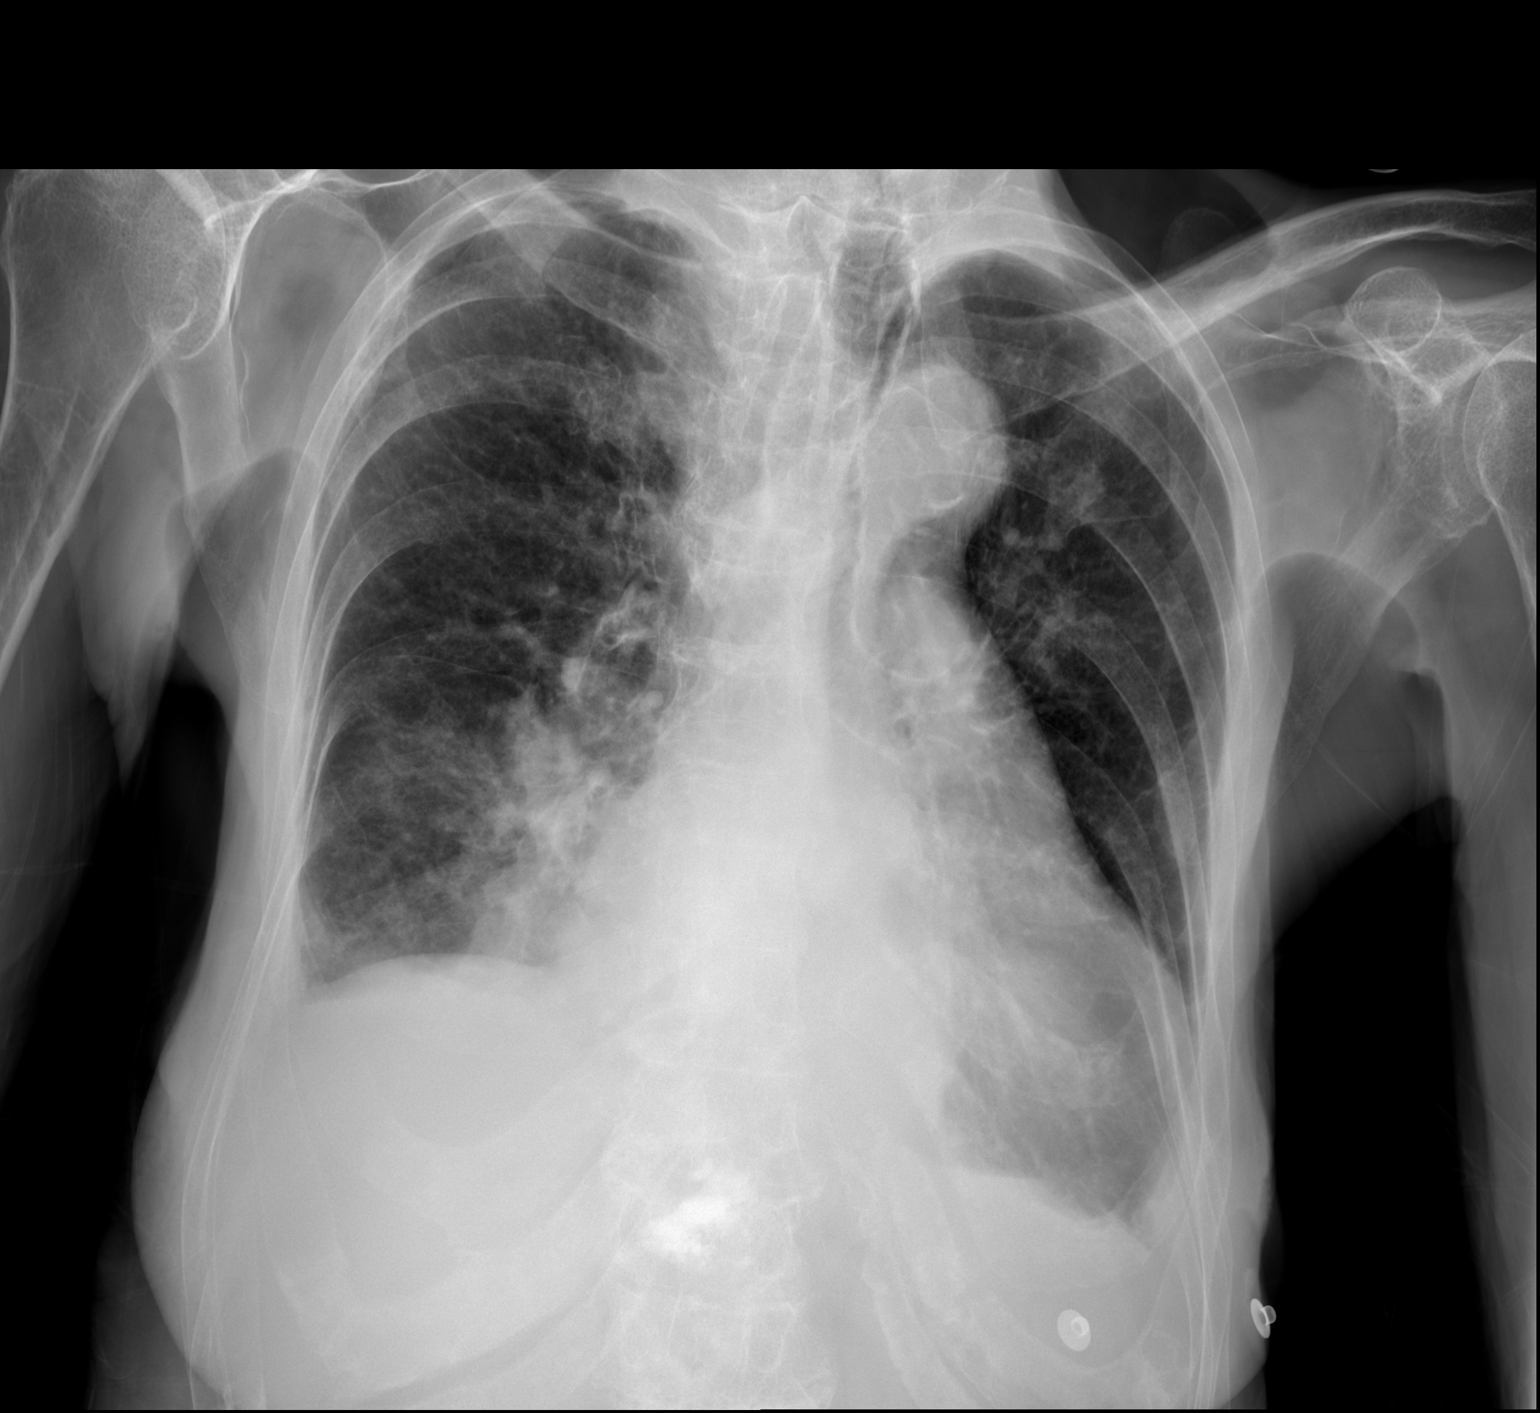

[2 of 2 positions shown; findings below may reference images not displayed]

FINDINGS: Cardiac shadow is mildly enlarged but stable. The lungs are well
expanded bilaterally. Persistent right basilar infiltrate is seen
but significantly improved when compared with the prior exam. Aortic
atherosclerotic changes are noted. No bony abnormality is noted.
IMPRESSION: Improving infiltrate in the right lung base when compared with the
prior exam.
